# Patient Record
Sex: Male | Born: 1950
Health system: Southern US, Community
[De-identification: ages and names within clinical notes are randomized; demographics above are authoritative.]

## PROBLEM LIST (undated history)

## (undated) DIAGNOSIS — I10 Essential (primary) hypertension: Secondary | ICD-10-CM

## (undated) DIAGNOSIS — T8859XA Other complications of anesthesia, initial encounter: Secondary | ICD-10-CM

## (undated) DIAGNOSIS — M889 Osteitis deformans of unspecified bone: Secondary | ICD-10-CM

## (undated) DIAGNOSIS — R972 Elevated prostate specific antigen [PSA]: Secondary | ICD-10-CM

## (undated) DIAGNOSIS — K648 Other hemorrhoids: Secondary | ICD-10-CM

## (undated) DIAGNOSIS — T4145XA Adverse effect of unspecified anesthetic, initial encounter: Secondary | ICD-10-CM

## (undated) DIAGNOSIS — D472 Monoclonal gammopathy: Secondary | ICD-10-CM

## (undated) DIAGNOSIS — D126 Benign neoplasm of colon, unspecified: Secondary | ICD-10-CM

## (undated) DIAGNOSIS — E559 Vitamin D deficiency, unspecified: Secondary | ICD-10-CM

## (undated) DIAGNOSIS — J189 Pneumonia, unspecified organism: Secondary | ICD-10-CM

## (undated) HISTORY — DX: Other hemorrhoids: K64.8

## (undated) HISTORY — PX: KNEE ARTHROSCOPY: SUR90

## (undated) HISTORY — DX: Vitamin D deficiency, unspecified: E55.9

## (undated) HISTORY — PX: BACK SURGERY: SHX140

## (undated) HISTORY — DX: Elevated prostate specific antigen (PSA): R97.20

## (undated) HISTORY — DX: Benign neoplasm of colon, unspecified: D12.6

## (undated) HISTORY — DX: Monoclonal gammopathy: D47.2

## (undated) HISTORY — DX: Osteitis deformans of unspecified bone: M88.9

---

## 2011-02-17 ENCOUNTER — Other Ambulatory Visit: Payer: Self-pay | Admitting: Otolaryngology

## 2011-02-21 ENCOUNTER — Encounter (HOSPITAL_COMMUNITY): Payer: Self-pay

## 2011-03-03 ENCOUNTER — Encounter (HOSPITAL_COMMUNITY): Payer: Self-pay

## 2011-03-03 ENCOUNTER — Encounter (HOSPITAL_COMMUNITY)
Admission: RE | Admit: 2011-03-03 | Discharge: 2011-03-03 | Disposition: A | Discharge status: Home / Self Care | Payer: BC Managed Care – PPO | Source: Ambulatory Visit | Attending: Specialist | Admitting: Specialist

## 2011-03-03 ENCOUNTER — Ambulatory Visit (HOSPITAL_COMMUNITY)
Admission: RE | Admit: 2011-03-03 | Discharge: 2011-03-03 | Disposition: A | Discharge status: Home / Self Care | Payer: BC Managed Care – PPO | Source: Ambulatory Visit | Attending: Otolaryngology | Admitting: Otolaryngology

## 2011-03-03 DIAGNOSIS — Z01812 Encounter for preprocedural laboratory examination: Secondary | ICD-10-CM | POA: Insufficient documentation

## 2011-03-03 DIAGNOSIS — M48061 Spinal stenosis, lumbar region without neurogenic claudication: Secondary | ICD-10-CM | POA: Insufficient documentation

## 2011-03-03 DIAGNOSIS — Z01811 Encounter for preprocedural respiratory examination: Secondary | ICD-10-CM | POA: Insufficient documentation

## 2011-03-03 HISTORY — DX: Adverse effect of unspecified anesthetic, initial encounter: T41.45XA

## 2011-03-03 HISTORY — DX: Essential (primary) hypertension: I10

## 2011-03-03 HISTORY — DX: Other complications of anesthesia, initial encounter: T88.59XA

## 2011-03-03 LAB — DIFFERENTIAL
Basophils Absolute: 0 10*3/uL (ref 0.0–0.1)
Lymphocytes Relative: 34 % (ref 12–46)
Neutro Abs: 3.2 10*3/uL (ref 1.7–7.7)
Neutrophils Relative %: 57 % (ref 43–77)

## 2011-03-03 LAB — COMPREHENSIVE METABOLIC PANEL
AST: 23 U/L (ref 0–37)
BUN: 9 mg/dL (ref 6–23)
CO2: 26 mEq/L (ref 19–32)
Calcium: 9.3 mg/dL (ref 8.4–10.5)
Creatinine, Ser: 0.78 mg/dL (ref 0.50–1.35)
GFR calc Af Amer: >90 mL/min (ref >90)
GFR calc non Af Amer: >90 mL/min (ref >90)
Glucose, Bld: 94 mg/dL (ref 70–99)

## 2011-03-03 LAB — CBC
MCH: 27.9 pg (ref 26.0–34.0)
MCV: 81.5 fL (ref 78.0–100.0)
Platelets: 277 10*3/uL (ref 150–400)
RBC: 5.19 MIL/uL (ref 4.22–5.81)

## 2011-03-03 LAB — URINALYSIS, ROUTINE W REFLEX MICROSCOPIC
Ketones, ur: NEGATIVE mg/dL
Leukocytes, UA: NEGATIVE
Protein, ur: NEGATIVE mg/dL
Urobilinogen, UA: 0.2 mg/dL (ref 0.0–1.0)

## 2011-03-03 LAB — PROTIME-INR
INR: 0.91 (ref 0.00–1.49)
Prothrombin Time: 12.5 seconds (ref 11.6–15.2)

## 2011-03-03 LAB — SURGICAL PCR SCREEN: MRSA, PCR: NEGATIVE

## 2011-03-03 MED ORDER — CHLORHEXIDINE GLUCONATE 4 % EX LIQD
60.0000 mL | Freq: Once | CUTANEOUS | Status: DC
  Filled 2011-03-03: qty 60

## 2011-03-03 NOTE — Patient Instructions (Signed)
[address] GOVERNOR MATOS  03/03/2011   Your procedure is scheduled on:  03/05/11  Surgery  1100-1304   Wednesday  Report to [facility] at  0830 AM.  Call this number if you have problems the morning of surgery: [phone]   Remember:   Do not eat food:After Midnight.  Tuesday night  May have clear liquids:until Midnight .  Tuesday night  Clear liquids include soda, tea, black coffee, apple or grape juice, broth.  Take these medicines the morning of surgery with A SIP OF WATER:Norco if needed with sip water   Do not wear jewelry, make-up or nail polish.  Do not wear lotions, powders, or perfumes. You may wear deodorant.  Do not shave 48 hours prior to surgery.  Do not bring valuables to the hospital.  Contacts, dentures or bridgework may not be worn into surgery.  Leave suitcase in the car. After surgery it may be brought to your room.  For patients admitted to the hospital, checkout time is 11:00 AM the day of discharge.   Patients discharged the day of surgery will not be allowed to drive home.  Name and phone number of your driver: wife  Special Instructions: CHG Shower Use Special Wash: 1/2 bottle night before surgery and 1/2 bottle morning of surgery.  Regular soap face and privates   Please read over the following fact sheets that you were given: MRSA Information

## 2011-03-05 ENCOUNTER — Ambulatory Visit (HOSPITAL_COMMUNITY): Payer: BC Managed Care – PPO | Admitting: Anesthesiology

## 2011-03-05 ENCOUNTER — Ambulatory Visit (HOSPITAL_COMMUNITY): Payer: BC Managed Care – PPO

## 2011-03-05 ENCOUNTER — Observation Stay (HOSPITAL_COMMUNITY)
Admission: RE | Admit: 2011-03-05 | Discharge: 2011-03-06 | Disposition: A | Discharge status: Home / Self Care | Payer: BC Managed Care – PPO | Source: Ambulatory Visit | Attending: Specialist | Admitting: Specialist

## 2011-03-05 ENCOUNTER — Encounter (HOSPITAL_COMMUNITY): Payer: Self-pay | Admitting: *Deleted

## 2011-03-05 ENCOUNTER — Encounter (HOSPITAL_COMMUNITY)
Admission: RE | Disposition: A | Discharge status: Home / Self Care | Payer: Self-pay | Source: Ambulatory Visit | Attending: Specialist

## 2011-03-05 ENCOUNTER — Encounter (HOSPITAL_COMMUNITY): Payer: Self-pay | Admitting: Anesthesiology

## 2011-03-05 DIAGNOSIS — I10 Essential (primary) hypertension: Secondary | ICD-10-CM | POA: Insufficient documentation

## 2011-03-05 DIAGNOSIS — Z79899 Other long term (current) drug therapy: Secondary | ICD-10-CM | POA: Insufficient documentation

## 2011-03-05 DIAGNOSIS — M5126 Other intervertebral disc displacement, lumbar region: Principal | ICD-10-CM | POA: Insufficient documentation

## 2011-03-05 DIAGNOSIS — M48061 Spinal stenosis, lumbar region without neurogenic claudication: Secondary | ICD-10-CM

## 2011-03-05 HISTORY — PX: LUMBAR LAMINECTOMY/DECOMPRESSION MICRODISCECTOMY: SHX5026

## 2011-03-05 SURGERY — LUMBAR LAMINECTOMY/DECOMPRESSION MICRODISCECTOMY
Anesthesia: General | Site: Back | Wound class: Clean

## 2011-03-05 MED ORDER — FENTANYL CITRATE 0.05 MG/ML IJ SOLN
INTRAMUSCULAR | Status: DC | PRN
  Administered 2011-03-05: 100 ug via INTRAVENOUS
  Administered 2011-03-05 (×5): 50 ug via INTRAVENOUS

## 2011-03-05 MED ORDER — METHOCARBAMOL 500 MG PO TABS: 500.0000 mg | ORAL_TABLET | Freq: Four times a day (QID) | ORAL | Status: DC | PRN

## 2011-03-05 MED ORDER — METHOCARBAMOL 100 MG/ML IJ SOLN
500.0000 mg | Freq: Four times a day (QID) | INTRAVENOUS | Status: DC | PRN
  Filled 2011-03-05: qty 5

## 2011-03-05 MED ORDER — NEOSTIGMINE METHYLSULFATE 1 MG/ML IJ SOLN
INTRAMUSCULAR | Status: DC | PRN
  Administered 2011-03-05: 4 mg via INTRAVENOUS

## 2011-03-05 MED ORDER — MIDAZOLAM HCL 5 MG/5ML IJ SOLN
INTRAMUSCULAR | Status: DC | PRN
  Administered 2011-03-05: 2 mg via INTRAVENOUS

## 2011-03-05 MED ORDER — PROMETHAZINE HCL 25 MG/ML IJ SOLN: 6.2500 mg | INTRAMUSCULAR | Status: DC | PRN

## 2011-03-05 MED ORDER — HYDROMORPHONE HCL PF 1 MG/ML IJ SOLN: 0.5000 mg | INTRAMUSCULAR | Status: DC | PRN

## 2011-03-05 MED ORDER — PHENOL 1.4 % MT LIQD
1.0000 | OROMUCOSAL | Status: DC | PRN
  Filled 2011-03-05: qty 177

## 2011-03-05 MED ORDER — LACTATED RINGERS IV SOLN: INTRAVENOUS | Status: DC

## 2011-03-05 MED ORDER — ONDANSETRON HCL 4 MG/2ML IJ SOLN: 4.0000 mg | INTRAMUSCULAR | Status: DC | PRN

## 2011-03-05 MED ORDER — SODIUM CHLORIDE 0.9 % IJ SOLN: 3.0000 mL | INTRAMUSCULAR | Status: DC | PRN

## 2011-03-05 MED ORDER — BUPIVACAINE-EPINEPHRINE 0.5% -1:200000 IJ SOLN
INTRAMUSCULAR | Status: DC | PRN
  Administered 2011-03-05: 50 mL

## 2011-03-05 MED ORDER — PROPOFOL 10 MG/ML IV BOLUS
INTRAVENOUS | Status: DC | PRN
  Administered 2011-03-05: 180 mg via INTRAVENOUS

## 2011-03-05 MED ORDER — SODIUM CHLORIDE 0.9 % IV SOLN: INTRAVENOUS | Status: DC

## 2011-03-05 MED ORDER — SODIUM CHLORIDE 0.9 % IJ SOLN
3.0000 mL | Freq: Two times a day (BID) | INTRAMUSCULAR | Status: DC
  Administered 2011-03-05 – 2011-03-06 (×2): 3 mL via INTRAVENOUS

## 2011-03-05 MED ORDER — CEFAZOLIN SODIUM-DEXTROSE 2-3 GM-% IV SOLR
2.0000 g | INTRAVENOUS | Status: AC
  Administered 2011-03-05: 2 g via INTRAVENOUS

## 2011-03-05 MED ORDER — CEFAZOLIN SODIUM-DEXTROSE 2-3 GM-% IV SOLR
2.0000 g | Freq: Three times a day (TID) | INTRAVENOUS | Status: AC
  Administered 2011-03-05 – 2011-03-06 (×2): 2 g via INTRAVENOUS
  Filled 2011-03-05 (×3): qty 50

## 2011-03-05 MED ORDER — HYDROMORPHONE HCL PF 1 MG/ML IJ SOLN: 0.2500 mg | INTRAMUSCULAR | Status: DC | PRN

## 2011-03-05 MED ORDER — SODIUM CHLORIDE 0.9 % IV SOLN: 250.0000 mL | INTRAVENOUS | Status: DC

## 2011-03-05 MED ORDER — THROMBIN 5000 UNITS EX KIT
PACK | CUTANEOUS | Status: DC | PRN
  Administered 2011-03-05: 10000 [IU] via TOPICAL

## 2011-03-05 MED ORDER — ROCURONIUM BROMIDE 100 MG/10ML IV SOLN
INTRAVENOUS | Status: DC | PRN
  Administered 2011-03-05 (×2): 10 mg via INTRAVENOUS
  Administered 2011-03-05: 50 mg via INTRAVENOUS

## 2011-03-05 MED ORDER — LIDOCAINE HCL (CARDIAC) 20 MG/ML IV SOLN
INTRAVENOUS | Status: DC | PRN
  Administered 2011-03-05: 80 mg via INTRAVENOUS

## 2011-03-05 MED ORDER — HYDROCODONE-ACETAMINOPHEN 7.5-325 MG PO TABS
1.0000 | ORAL_TABLET | ORAL | Status: DC | PRN
  Administered 2011-03-05 – 2011-03-06 (×2): 2 via ORAL
  Filled 2011-03-05 (×2): qty 2

## 2011-03-05 MED ORDER — ACETAMINOPHEN 10 MG/ML IV SOLN
INTRAVENOUS | Status: DC | PRN
  Administered 2011-03-05: 1000 mg via INTRAVENOUS

## 2011-03-05 MED ORDER — ZOLPIDEM TARTRATE 10 MG PO TABS: 10.0000 mg | ORAL_TABLET | Freq: Every evening | ORAL | Status: DC | PRN

## 2011-03-05 MED ORDER — ACETAMINOPHEN 650 MG RE SUPP: 650.0000 mg | RECTAL | Status: DC | PRN

## 2011-03-05 MED ORDER — DOCUSATE SODIUM 100 MG PO CAPS
100.0000 mg | ORAL_CAPSULE | Freq: Two times a day (BID) | ORAL | Status: DC
  Administered 2011-03-05 – 2011-03-06 (×2): 100 mg via ORAL
  Filled 2011-03-05 (×3): qty 1

## 2011-03-05 MED ORDER — AMLODIPINE BESYLATE 5 MG PO TABS
5.0000 mg | ORAL_TABLET | Freq: Every day | ORAL | Status: DC
  Administered 2011-03-06: 5 mg via ORAL
  Filled 2011-03-05: qty 1

## 2011-03-05 MED ORDER — LACTATED RINGERS IV SOLN
INTRAVENOUS | Status: DC
  Administered 2011-03-05: 12:00:00 via INTRAVENOUS
  Administered 2011-03-05: 1000 mL via INTRAVENOUS

## 2011-03-05 MED ORDER — AMLODIPINE BESYLATE 5 MG PO TABS
5.0000 mg | ORAL_TABLET | Freq: Every day | ORAL | Status: DC
  Administered 2011-03-05: 5 mg via ORAL
  Filled 2011-03-05: qty 1

## 2011-03-05 MED ORDER — OLMESARTAN MEDOXOMIL 40 MG PO TABS
40.0000 mg | ORAL_TABLET | Freq: Every day | ORAL | Status: DC
  Administered 2011-03-06: 40 mg via ORAL
  Filled 2011-03-05: qty 1

## 2011-03-05 MED ORDER — MENTHOL 3 MG MT LOZG
1.0000 | LOZENGE | OROMUCOSAL | Status: DC | PRN
  Filled 2011-03-05: qty 9

## 2011-03-05 MED ORDER — AMLODIPINE-OLMESARTAN 5-40 MG PO TABS: 1.0000 | ORAL_TABLET | ORAL | Status: DC

## 2011-03-05 MED ORDER — SODIUM CHLORIDE 0.9 % IR SOLN
Status: DC | PRN
  Administered 2011-03-05: 11:00:00

## 2011-03-05 MED ORDER — GLYCOPYRROLATE 0.2 MG/ML IJ SOLN
INTRAMUSCULAR | Status: DC | PRN
  Administered 2011-03-05: .6 mg via INTRAVENOUS

## 2011-03-05 MED ORDER — ONDANSETRON HCL 4 MG/2ML IJ SOLN
INTRAMUSCULAR | Status: DC | PRN
  Administered 2011-03-05: 4 mg via INTRAVENOUS

## 2011-03-05 SURGICAL SUPPLY — 43 items
BAG ZIPLOCK 12X15 (MISCELLANEOUS) ×2 IMPLANT
BENZOIN TINCTURE PRP APPL 2/3 (GAUZE/BANDAGES/DRESSINGS) ×2 IMPLANT
CHLORAPREP W/TINT 26ML (MISCELLANEOUS) IMPLANT
CLEANER TIP ELECTROSURG 2X2 (MISCELLANEOUS) ×2 IMPLANT
CLOTH BEACON ORANGE TIMEOUT ST (SAFETY) ×2 IMPLANT
DECANTER SPIKE VIAL GLASS SM (MISCELLANEOUS) ×2 IMPLANT
DRAPE MICROSCOPE LEICA (MISCELLANEOUS) ×2 IMPLANT
DRAPE POUCH INSTRU U-SHP 10X18 (DRAPES) ×2 IMPLANT
DRAPE SURG 17X11 SM STRL (DRAPES) ×2 IMPLANT
DRSG EMULSION OIL 3X3 NADH (GAUZE/BANDAGES/DRESSINGS) IMPLANT
DRSG PAD ABDOMINAL 8X10 ST (GAUZE/BANDAGES/DRESSINGS) IMPLANT
DRSG TELFA 4X5 ISLAND ADH (GAUZE/BANDAGES/DRESSINGS) IMPLANT
DURAPREP 26ML APPLICATOR (WOUND CARE) ×2 IMPLANT
ELECT REM PT RETURN 9FT ADLT (ELECTROSURGICAL) ×2
ELECTRODE REM PT RTRN 9FT ADLT (ELECTROSURGICAL) ×1 IMPLANT
GLOVE BIOGEL PI IND STRL 6.5 (GLOVE) ×1 IMPLANT
GLOVE BIOGEL PI IND STRL 8 (GLOVE) ×1 IMPLANT
GLOVE BIOGEL PI INDICATOR 6.5 (GLOVE) ×1
GLOVE BIOGEL PI INDICATOR 8 (GLOVE) ×1
GLOVE ECLIPSE 6.5 STRL STRAW (GLOVE) ×2 IMPLANT
GLOVE SURG SS PI 8.0 STRL IVOR (GLOVE) ×4 IMPLANT
KIT BASIN OR (CUSTOM PROCEDURE TRAY) ×2 IMPLANT
KIT POSITIONING SURG ANDREWS (MISCELLANEOUS) ×2 IMPLANT
MANIFOLD NEPTUNE II (INSTRUMENTS) ×2 IMPLANT
NEEDLE SPNL 18GX3.5 QUINCKE PK (NEEDLE) ×6 IMPLANT
PATTIES SURGICAL .5 X.5 (GAUZE/BANDAGES/DRESSINGS) IMPLANT
PATTIES SURGICAL .75X.75 (GAUZE/BANDAGES/DRESSINGS) IMPLANT
PATTIES SURGICAL 1X1 (DISPOSABLE) IMPLANT
SPONGE SURGIFOAM ABS GEL 100 (HEMOSTASIS) ×2 IMPLANT
STAPLER VISISTAT (STAPLE) IMPLANT
STRIP CLOSURE SKIN 1/2X4 (GAUZE/BANDAGES/DRESSINGS) IMPLANT
SUT PROLENE 3 0 PS 2 (SUTURE) IMPLANT
SUT VIC AB 0 CT1 27 (SUTURE)
SUT VIC AB 0 CT1 27XBRD ANTBC (SUTURE) IMPLANT
SUT VIC AB 1 CT1 27 (SUTURE) ×1
SUT VIC AB 1 CT1 27XBRD ANTBC (SUTURE) ×1 IMPLANT
SUT VIC AB 1-0 CT2 27 (SUTURE) IMPLANT
SUT VIC AB 2-0 CT1 27 (SUTURE) ×1
SUT VIC AB 2-0 CT1 TAPERPNT 27 (SUTURE) ×1 IMPLANT
SUT VICRYL 0 UR6 27IN ABS (SUTURE) IMPLANT
SYRINGE 10CC LL (SYRINGE) ×4 IMPLANT
TRAY LAMINECTOMY (CUSTOM PROCEDURE TRAY) ×2 IMPLANT
YANKAUER SUCT BULB TIP NO VENT (SUCTIONS) ×2 IMPLANT

## 2011-03-05 NOTE — Transfer of Care (Signed)
Immediate Anesthesia Transfer of Care Note  Patient: Lance King  Procedure(s) Performed:  LUMBAR LAMINECTOMY/DECOMPRESSION MICRODISCECTOMY - Re do Decompression L3-L5  (X-Ray)    Central  Patient Location: PACU  Anesthesia Type: General  Level of Consciousness: awake and sedated  Airway & Oxygen Therapy: Patient Spontanous Breathing and Patient connected to face mask oxygen  Post-op Assessment: Report given to PACU RN, Post -op Vital signs reviewed and stable and Patient moving all extremities  Post vital signs: Reviewed and stable  Complications: No apparent anesthesia complications

## 2011-03-05 NOTE — Anesthesia Postprocedure Evaluation (Signed)
  Anesthesia Post-op Note  Patient: Lance King  Procedure(s) Performed:  LUMBAR LAMINECTOMY/DECOMPRESSION MICRODISCECTOMY - Re do Decompression L3-L5  (X-Ray)    Central  Patient Location: PACU  Anesthesia Type: General  Level of Consciousness: awake and alert   Airway and Oxygen Therapy: Patient Spontanous Breathing  Post-op Pain: mild  Post-op Assessment: Post-op Vital signs reviewed, Patient's Cardiovascular Status Stable, Respiratory Function Stable, Patent Airway and No signs of Nausea or vomiting  Post-op Vital Signs: stable  Complications: No apparent anesthesia complications

## 2011-03-05 NOTE — Anesthesia Preprocedure Evaluation (Addendum)
Anesthesia Evaluation  Patient identified by MRN, date of birth, ID band Patient awake    Reviewed: Allergy & Precautions, H&P , NPO status , Patient's Chart, lab work & pertinent test results  History of Anesthesia Complications Negative for: history of anesthetic complications  Airway Mallampati: II TM Distance: >3 FB Neck ROM: full    Dental  (+) Caps, Teeth Intact and Dental Advisory Given,    Pulmonary neg pulmonary ROS,  clear to auscultation  Pulmonary exam normal       Cardiovascular Exercise Tolerance: Good hypertension, On Medications neg cardio ROS regular Normal    Neuro/Psych Negative Neurological ROS  Negative Psych ROS   GI/Hepatic negative GI ROS, Neg liver ROS,   Endo/Other  Negative Endocrine ROS  Renal/GU negative Renal ROS  Genitourinary negative   Musculoskeletal   Abdominal   Peds  Hematology negative hematology ROS (+)   Anesthesia Other Findings   Reproductive/Obstetrics negative OB ROS                          Anesthesia Physical Anesthesia Plan  ASA: II  Anesthesia Plan: General   Post-op Pain Management:    Induction: Intravenous  Airway Management Planned: Oral ETT  Additional Equipment:   Intra-op Plan:   Post-operative Plan: Extubation in OR  Informed Consent: I have reviewed the patients History and Physical, chart, labs and discussed the procedure including the risks, benefits and alternatives for the proposed anesthesia with the patient or authorized representative who has indicated his/her understanding and acceptance.   Dental Advisory Given  Plan Discussed with: CRNA and Surgeon  Anesthesia Plan Comments:        Anesthesia Quick Evaluation

## 2011-03-05 NOTE — Op Note (Signed)
NAME:  WILLOW, RECZEK NO.:  [id number]  MEDICAL RECORD NO.:  [id number]  LOCATION:  [facility]                         FACILITY:  [facility]  PHYSICIAN:  Jene Every, M.D.    DATE OF BIRTH:  17-May-1950  DATE OF PROCEDURE:  03/05/2011 DATE OF DISCHARGE:                              OPERATIVE REPORT   PREOPERATIVE DIAGNOSES:  Spinal stenosis L3-L4, L4-L5, recurrent and herniated nucleus pulposus at L3-L4.  POSTOPERATIVE DIAGNOSES:  Spinal stenosis L3-L4, L4-L5, recurrent and herniated nucleus pulposus at L3-L4.  PROCEDURE PERFORMED:  Lumbar decompression at L3-L4,  L4-L5 with bilateral hemilaminotomies, lateral recessed decompression with foraminotomies at L4 and L5.  Microdiskectomy at L3-L4, left.  ANESTHESIA:  General.  ASSISTANT:  Roma Schanz, P.A.  BRIEF HISTORY AND INDICATION:  This is a 60 year old with predominant lower extremity radicular pain secondary to severe lateral recess stenosis, L4-L5, disk herniation L3-L4, and stenosis at L3-L4 as well, and stenosis at L4-L5 with history of decompression of L5-S1.  There is some minor lateral recess at L5-S1, but did not have L5-S1 symptomatology.  It was indicated for decompression at L3-L4 and L4-L5, essentially into the left due to the severe stenosis.  Risks and benefits were discussed including bleeding, infection, damage to other structures.  No change in symptoms, worsening symptoms, repeat debridement, DVT, PE, anesthetic complications etc.  TECHNIQUE:  With the patient in supine position, after induction of adequate general anesthesia, 2 g Kefzol,, was placed prone on the [place] frame.  All bony prominences were well padded.  Foley to gravity.  Abdomen free.  Arms in the well-padded position less, than 90 degrees at the shoulders.  The lumbar region was prepped and draped in the usual sterile fashion.  Two 18-gauge spinal needles were utilized to localize the spinous process of L3-L5.  We made an  incision from the spinous process of L3-L5.  Subcutaneous tissue was dissected. Electrocautery utilized to achieve hemostasis.  Dorsolumbar fascia identified by line of skin incision.  0.25% Marcaine with epinephrine was infiltrated in the subcutaneous tissue, into the paraspinous muscular.  McCullough retractor was placed.  We elevated the paraspinous musculature from lamina of 3, 4, and 5.  Confirmatory radiograph was obtained at L3 and at L4-L5. We removed the spinous processes through small interlaminar windows at L3-L4 and L4-5.  To preserve the facet, we decided to perform a central decompression and remove the spinous processes of L4 and L3.  Operating microscope was draped, brought on the surgical field.  Hemilaminotomy of the caudad edge of L4 was performed with a 2 mm Kerrison.  Severe ligamentum flavum hypertrophy was noted bilaterally, extending to near the midline.  With the neural elements well protected with neural patties, we decompressed both lateral recesses to the medial border of the pedicle.  It was more severe on the left than on the right with compression of the 5 root and the 4 root in the lateral recess, into the foramen.  With the neural elements well protected, we performed foraminotomies of L4 and L5 bilaterally.  Hockey- stick probe was passed freely at both those foramens following the decompression, especially down at the foramen of L5, beneath the pedicle of  L5, distal to L5-S1 without evidence of stenosis.  In a similar fashion, we performed hemilaminotomies of the cephalad edge of L4 bilaterally and the caudad edge of L3.  Hypertrophic ligamentum flavum was noted centrally and laterally as well as at L4-L5.  Again this severe lateral recess stenosis was noted bilaterally with decompressed lateral recess to the medial border of the pedicle.  Epidural venous plexus was noted on the left as well as disk herniation at L3-L4, which was identified with an  18-gauge needle.  An x-ray demonstrated L3-L4 and decompression at 5.  After the foraminotomies of L4, we gently mobilized the thecal sac immediately as well as the root of L4 and performed an annulotomy.  Disk material was removed from the disk space with micropituitary, degenerated disks, and had multiple passes to remove the gelatinous degenerated herniated disk.  Further compressed disk with a hockey stick, herniated material was then removed.  We performed foraminotomy at L3 and hockey-stick probe passed freely up the foramen of L3 and L4 with bipolar cautery to achieve hemostasis.  We irrigated the disk space with antibiotic irrigation through Angiocath.  Copiously irrigated the laminotomies and placed bone wax on the cancellous surfaces.  Good restoration of the thecal sac without CSF leakage or active bleeding.  Good decompression on the right as well.  Thrombin- soaked Gelfoam was placed in the laminotomy defect at L3-L4 and L4-L5 after copious irrigation.  Again, good restoration of the thecal sac without evidence of bleeding or CSF leakage.  I then removed the [facility] retractor.  Paraspinous muscle was inspected and electrocautery utilized to achieve hemostasis.  Copious irrigation was used once again.  The dorsolumbar fascia was reapproximated with #1 Vicryl interrupted figure-of-8 sutures, along with the paraspinous musculature, subcu with 0 and 2-0 Vicryl simple sutures.  Skin was reapproximated with staples.  The wound was dressed sterilely.  He was placed supine on the hospital bed, extubated without difficulty, transported to recovery room in satisfactory condition.  The patient tolerated the procedure well.  No complications.  Assistant Roma Schanz was utilized for assistance with gentle nerve retraction, and suction to provide visualization.     Jene Every, M.D.     Cordelia Pen  D:  03/05/2011  T:  03/05/2011  Job:  [id number]

## 2011-03-05 NOTE — Brief Op Note (Signed)
03/05/2011  12:55 PM  PATIENT:  Lance King  60 y.o. male  PRE-OPERATIVE DIAGNOSIS:  Spinal stenosis, lumbar region, without neurogenic claudication [724.02]  POST-OPERATIVE DIAGNOSIS:  spinal stenosis L3-4 L4-5  PROCEDURE:  Procedure(s): LUMBAR LAMINECTOMY/DECOMPRESSION MICRODISCECTOMY L34, L45  SURGEON:  Surgeon(s): Lacretia Leigh Amaziah Ghosh  PHYSICIAN ASSISTANT:   ASSISTANTS: Strader   ANESTHESIA:   general  EBL:  Total I/O In: 1150 [I.V.:1000; IV Piggyback:150] Out: 425 [Urine:375; Blood:50]  BLOOD ADMINISTERED:none  DRAINS: none   LOCAL MEDICATIONS USED:  MARCAINE 20CC  SPECIMEN:  No Specimen  DISPOSITION OF SPECIMEN:  N/A  COUNTS:  YES  TOURNIQUET:  * No tourniquets in log *  DICTATION: .Other Dictation: Dictation Number [id number]  PLAN OF CARE: Admit for overnight observation  PATIENT DISPOSITION:  PACU - hemodynamically stable.   Delay start of Pharmacological VTE agent (>24hrs) due to surgical blood loss or risk of bleeding:  {YES/NO/NOT APPLICABLE:20182

## 2011-03-05 NOTE — H&P (Signed)
Lance King is an 60 y.o. male.   Chief Complaint:Bilateral claudication HPI: 60 yo with neurogenic claudication due to spinal stenosis L34, L45  Past Medical History  Diagnosis Date  . Complication of anesthesia     states a smaller tube has been used in the past due to sore throat  . Hypertension     EKG PCP 02/05/11 on chart    Past Surgical History  Procedure Date  . Back surgery     Lumbar decompression 1994  . Knee arthroscopy     1987    History reviewed. No pertinent family history. Social History:  reports that he has never smoked. He has never used smokeless tobacco. He reports that he drinks about .6 ounces of alcohol per week. He reports that he does not use illicit drugs.  Allergies: No Known Allergies  Medications Prior to Admission  Medication Dose Route Frequency Provider Last Rate Last Dose  . amLODipine (NORVASC) tablet 5 mg  5 mg Oral Daily Keionte Swicegood C Keshanna Riso   5 mg at 03/05/11 0851  . ceFAZolin (ANCEF) IVPB 2 g/50 mL premix  2 g Intravenous 60 min Pre-Op [name], PA      . lactated ringers infusion   Intravenous Continuous [name], PA 50 mL/hr at 03/05/11 1004 1,000 mL at 03/05/11 1004   Medications Prior to Admission  Medication Sig Dispense Refill  . amLODipine-olmesartan (AZOR) 5-40 MG per tablet Take 1 tablet by mouth every morning.       Marland Kitchen HYDROcodone-acetaminophen (NORCO) 7.5-325 MG per tablet Take 1 tablet by mouth every 6 (six) hours as needed. For pain        Results for orders placed during the hospital encounter of 03/03/11 (from the past 48 hour(s))  CBC     Status: Normal   Collection Time   03/03/11 10:35 AM      Component Value Range Comment   WBC 5.6  4.0 - 10.5 (K/uL)    RBC 5.19  4.22 - 5.81 (MIL/uL)    Hemoglobin 14.5  13.0 - 17.0 (g/dL)    HCT 62.1  30.8 - 65.7 (%)    MCV 81.5  78.0 - 100.0 (fL)    MCH 27.9  26.0 - 34.0 (pg)    MCHC 34.3  30.0 - 36.0 (g/dL)    RDW 84.6  96.2 - 95.2 (%)    Platelets 277   150 - 400 (K/uL)   COMPREHENSIVE METABOLIC PANEL     Status: Abnormal   Collection Time   03/03/11 10:35 AM      Component Value Range Comment   Sodium 140  135 - 145 (mEq/L)    Potassium 3.8  3.5 - 5.1 (mEq/L)    Chloride 104  96 - 112 (mEq/L)    CO2 26  19 - 32 (mEq/L)    Glucose, Bld 94  70 - 99 (mg/dL)    BUN 9  6 - 23 (mg/dL)    Creatinine, Ser 8.41  0.50 - 1.35 (mg/dL)    Calcium 9.3  8.4 - 10.5 (mg/dL)    Total Protein 6.9  6.0 - 8.3 (g/dL)    Albumin 3.9  3.5 - 5.2 (g/dL)    AST 23  0 - 37 (U/L) NO VISIBLE HEMOLYSIS   ALT 34  0 - 53 (U/L)    Alkaline Phosphatase 167 (*) 39 - 117 (U/L)    Total Bilirubin 0.8  0.3 - 1.2 (mg/dL)    GFR calc  non Af Amer >90  >90 (mL/min)    GFR calc Af Amer >90  >90 (mL/min)   DIFFERENTIAL     Status: Normal   Collection Time   03/03/11 10:35 AM      Component Value Range Comment   Neutrophils Relative 57  43 - 77 (%)    Neutro Abs 3.2  1.7 - 7.7 (K/uL)    Lymphocytes Relative 34  12 - 46 (%)    Lymphs Abs 1.9  0.7 - 4.0 (K/uL)    Monocytes Relative 8  3 - 12 (%)    Monocytes Absolute 0.5  0.1 - 1.0 (K/uL)    Eosinophils Relative 0  0 - 5 (%)    Eosinophils Absolute 0.0  0.0 - 0.7 (K/uL)    Basophils Relative 0  0 - 1 (%)    Basophils Absolute 0.0  0.0 - 0.1 (K/uL)   PROTIME-INR     Status: Normal   Collection Time   03/03/11 10:35 AM      Component Value Range Comment   Prothrombin Time 12.5  11.6 - 15.2 (seconds)    INR 0.91  0.00 - 1.49    SURGICAL PCR SCREEN     Status: Normal   Collection Time   03/03/11 10:39 AM      Component Value Range Comment   MRSA, PCR NEGATIVE  NEGATIVE     Staphylococcus aureus NEGATIVE  NEGATIVE    ABO/RH     Status: Normal   Collection Time   03/03/11 11:00 AM      Component Value Range Comment   ABO/RH(D) O NEG       Chest 2 View  03/03/2011  *RADIOLOGY REPORT*  Clinical Data: Preoperative respiratory films.  CHEST - 2 VIEW  Comparison: None.  Findings: The lungs are clear.  Heart size is normal.   No pneumothorax or pleural effusion.  IMPRESSION: Negative chest.  Original Report Authenticated By: Bernadene Bell. Maricela Curet, M.D.    Review of Systems  Constitutional: Negative.   HENT: Negative.   Eyes: Negative.   Respiratory: Negative.   Cardiovascular: Negative.   Gastrointestinal: Negative.   Genitourinary: Negative.   Musculoskeletal: Positive for back pain.  Skin: Negative.   Neurological: Positive for tingling.  Endo/Heme/Allergies: Negative.   Psychiatric/Behavioral: Negative.     Blood pressure 143/80, pulse 80, temperature 98.2 F (36.8 C), temperature source Oral, resp. rate 18, SpO2 100.00%. Physical Exam  Constitutional: He appears well-developed.  HENT:  Head: Normocephalic and atraumatic.  Eyes: Pupils are equal, round, and reactive to light.  Neck: Normal range of motion.  Cardiovascular: Normal rate.   Respiratory: Effort normal.  GI: Soft.  Musculoskeletal: Normal range of motion.  Neurological:       Positive straight leg raise bilat. bilat quad weakness. Pulses intact. No DVT     Assessment/Plan Neurogenic claudication due to spinal stenosis L34, L45. Plan decompression L34, L45. Risks and benefits discussed.  [name] C 03/05/2011, 10:32 AM

## 2011-03-06 LAB — CBC
Hemoglobin: 12.6 g/dL — ABNORMAL LOW (ref 13.0–17.0)
RBC: 4.6 MIL/uL (ref 4.22–5.81)

## 2011-03-06 LAB — BASIC METABOLIC PANEL
CO2: 29 mEq/L (ref 19–32)
Glucose, Bld: 104 mg/dL — ABNORMAL HIGH (ref 70–99)
Potassium: 4 mEq/L (ref 3.5–5.1)
Sodium: 138 mEq/L (ref 135–145)

## 2011-03-06 MED ORDER — INFLUENZA VIRUS VACC SPLIT PF IM SUSP
0.5000 mL | INTRAMUSCULAR | Status: DC | PRN
  Filled 2011-03-06: qty 0.5

## 2011-03-06 MED ORDER — INFLUENZA VIRUS VACC SPLIT PF IM SUSP: 0.5000 mL | INTRAMUSCULAR | Status: DC

## 2011-03-06 MED ORDER — HYDROCODONE-ACETAMINOPHEN 7.5-325 MG PO TABS: 1.0000 | ORAL_TABLET | ORAL | Status: AC | PRN

## 2011-03-06 NOTE — Progress Notes (Signed)
Pt discharged home with wife in stable condition. Discharge instructions and scripts given. Pt verbalized understanding.  

## 2011-03-06 NOTE — Discharge Summary (Signed)
Physician Discharge Summary  Patient ID: Lance King MRN: [id number] DOB/AGE: September 03, 1950 60 y.o.  Admit date: 03/05/2011 Discharge date: 03/06/2011  Admission Diagnoses: Spinal stenosis L3-4, L4-5 Discharge Diagnoses:  S/p lumbar decompression L3-4, L4-5   Discharged Condition: good  Hospital Course: Uneventful.  Patient did well post operatively.  He noted minimal low back pain and improvement in LE symptoms.  He was voiding and walking without difficulty.  Basename 03/06/11 0330  WBC 8.6  HCT 37.6*  PLT 247    Basename 03/06/11 0330  NA 138  K 4.0  CL 105  CO2 29  GLUCOSE 104*  BUN 7  CREATININE 0.90  CALCIUM 8.9    Procedure Note: PRE-OPERATIVE DIAGNOSIS: Spinal stenosis, lumbar region, without neurogenic claudication [724.02]  POST-OPERATIVE DIAGNOSIS: spinal stenosis L3-4 L4-5  PROCEDURE: Procedure(s):  LUMBAR LAMINECTOMY/DECOMPRESSION MICRODISCECTOMY L34, L45  SURGEON: Surgeon(s):  [employer]  PHYSICIAN ASSISTANT:  ASSISTANTS: Levelle Edelen  ANESTHESIA: general  EBL: Total I/O  In: 1150 [I.V.:1000; IV Piggyback:150]  Out: 425 [Urine:375; Blood:50]  BLOOD ADMINISTERED:none  DRAINS: none  LOCAL MEDICATIONS USED: MARCAINE 20CC  SPECIMEN: No Specimen  DISPOSITION OF SPECIMEN: N/A  COUNTS: YES  TOURNIQUET: * No tourniquets in log *   Consults: none  Disposition: Home or Self Care  Discharge Orders    Future Orders Please Complete By Expires   Diet - low sodium heart healthy      Call MD / Call 911      Comments:   If you experience chest pain or shortness of breath, CALL 911 and be transported to the hospital emergency room.  If you develope a fever above 101 F, pus (white drainage) or increased drainage or redness at the wound, or calf pain, call your surgeon's office.   Constipation Prevention      Comments:   Drink plenty of fluids.  Prune juice may be helpful.  You may use a stool softener, such as Colace (over the counter) 100 mg twice a day.   Use MiraLax (over the counter) for constipation as needed.   Increase activity slowly as tolerated      Weight Bearing as taught in Physical Therapy      Comments:   Use a walker or crutches as instructed.   Discharge instructions      Comments:   Walk As Tolerated utilizing back precautions.  No bending, twisting, or lifting.  No driving for 2 weeks.  Ok to shower in 72 hours. Start Aspirin 81mg  tomorrow daily for 3 weeks   Care order/instruction      Comments:   Dressing supplies for home     Discharge Medication List as of 03/06/2011 11:14 AM    CONTINUE these medications which have CHANGED   Details  HYDROcodone-acetaminophen (NORCO) 7.5-325 MG per tablet Take 1-2 tablets by mouth every 4 (four) hours as needed., Starting 03/06/2011, Until Sun 03/16/11, Print      CONTINUE these medications which have NOT CHANGED   Details  amLODipine-olmesartan (AZOR) 5-40 MG per tablet Take 1 tablet by mouth every morning. , Until Discontinued, Historical Med       Follow-up Information    Follow up with BEANE,JEFFREY C. Make an appointment in 14 days.   Contact information:   [facility] [address] [place] [address] [phone]          Signed: [name]. 03/06/2011, 1:17 PM

## 2011-03-06 NOTE — Progress Notes (Signed)
Occupational Therapy Evaluation Patient Details Name: Lance King MRN: [id number] DOB: [birth year] Today's Date: 03/06/2011 8:42-9:07 e1 Problem List: There is no problem list on file for this patient.   Past Medical History:  Past Medical History  Diagnosis Date  . Complication of anesthesia     states a smaller tube has been used in the past due to sore throat  . Hypertension     EKG PCP 02/05/11 on chart   Past Surgical History:  Past Surgical History  Procedure Date  . Back surgery     Lumbar decompression 1994  . Knee arthroscopy     1987    OT Assessment/Plan/Recommendation OT Assessment Clinical Impression Statement: Pleasant 60 year old male admitted for elective lumbar discectomy and laminectomy.  Currently able to perform all simulated ADLs and toileting at a supervision level.  He reports that his wife  will be able to provide 24 hour supervsion at d/c.  Does not require any OT related DME.  Did educate patient on  back precautions as well  as available AE for LB selfcare.  No further OT needs. OT Recommendation/Assessment: Patient does not need any further OT services OT Recommendation Follow Up Recommendations: None Equipment Recommended: None recommended by OT Individuals Consulted Consulted and Agree with Results and Recommendations: Patient OT Goals    OT Evaluation Precautions/Restrictions  Precautions Precautions: Back Required Braces or Orthoses: No Restrictions Weight Bearing Restrictions: No Prior Functioning Home Living Lives With: Spouse Receives Help From: Family Type of Home: House Home Layout: One level Home Access: Stairs to enter Entrance Stairs-Rails: Right Entrance Stairs-Number of Steps: 3 Bathroom Shower/Tub: Engineer, manufacturing systems: Handicapped height Bathroom Accessibility: Yes Home Adaptive Equipment: None Prior Function Level of Independence: Independent with basic ADLs;Independent with transfers;Independent with  homemaking with wheelchair Vocation: Retired ADL ADL Eating/Feeding: Simulated;Independent Where Assessed - Eating/Feeding: Chair Grooming: Simulated;Supervision/safety Where Assessed - Grooming: Sitting, chair Upper Body Bathing: Simulated;Set up Where Assessed - Upper Body Bathing: Sit to stand from bed Lower Body Bathing: Performed;Supervision/safety Where Assessed - Lower Body Bathing: Sit to stand from bed Upper Body Dressing: Performed;Set up Where Assessed - Upper Body Dressing: Sit to stand from bed Lower Body Dressing: Performed;Set up Where Assessed - Lower Body Dressing: Sit to stand from bed Toilet Transfer: Simulated;Supervision/safety Toilet Transfer Method: Proofreader: Comfort height toilet Toileting - Clothing Manipulation: Simulated;Supervision/safety Where Assessed - Toileting Clothing Manipulation:  (Sit to stand from EOB) Toileting - Hygiene: Simulated;Supervision/safety Where Assessed - Toileting Hygiene:  (Sit to stand from EOB.) Tub/Shower Transfer: Not assessed Tub/Shower Transfer Method: Not assessed ADL Comments: Pt overall supervision for most selfcare tasks adhering to back precautions.  Educated on back precautions as well as possible need for reacher.  Pt reports that his wife will assist him PRN.  No further OT needs. Vision/Perception  Vision - History Baseline Vision: Wears glasses all the time Perception Perception: Within Functional Limits Praxis Praxis: Intact Cognition Cognition Arousal/Alertness: Awake/alert Overall Cognitive Status: Appears within functional limits for tasks assessed Orientation Level: Oriented X4 Sensation/Coordination Sensation Light Touch: Appears Intact Coordination Gross Motor Movements are Fluid and Coordinated: Yes Fine Motor Movements are Fluid and Coordinated: Yes Extremity Assessment RUE Assessment RUE Assessment: Within Functional Limits LUE Assessment LUE Assessment: Within  Functional Limits Mobility  Bed Mobility Bed Mobility: Yes Rolling Right: 5: Supervision Supine to Sit: 5: Supervision Transfers Transfers: Yes Sit to Stand: 5: Supervision Exercises   End of Session OT - End of Session Activity Tolerance:  Patient tolerated treatment well Patient left: in chair;with call bell in reach General Behavior During Session: [facility] for tasks performed Cognition: [facility] for tasks performed   Halbert Jesson,Akiva OTR/L 03/06/2011, 9:30 AM  Pager number [phone]

## 2011-03-06 NOTE — Progress Notes (Signed)
Subjective: 1 Day Post-Op Procedure(s) (LRB): LUMBAR LAMINECTOMY/DECOMPRESSION MICRODISCECTOMY (N/A) Patient reports pain as 2 on 0-10 scale.    Patient has complaints of mild low back pain.  Denies any LE pain, much improved  Objective: Vital signs in last 24 hours: Temp:  [98.1 F (36.7 C)-99.8 F (37.7 C)] 99 F (37.2 C) (12/06 0614) Pulse Rate:  [72-89] 81  (12/06 0614) Resp:  [15-21] 16  (12/06 0614) BP: (104-152)/(54-84) 104/54 mmHg (12/06 0614) SpO2:  [96 %-100 %] 96 % (12/06 0845) Weight:  [84.823 kg (187 lb)] 187 lb (84.823 kg) (12/05 1645)  Intake/Output from previous day:  Intake/Output Summary (Last 24 hours) at 03/06/11 1013 Last data filed at 03/06/11 0500  Gross per 24 hour  Intake   1750 ml  Output   2575 ml  Net   -825 ml    Intake/Output this shift:    Labs: Results for orders placed during the hospital encounter of 03/05/11  CBC      Component Value Range   WBC 8.6  4.0 - 10.5 (K/uL)   RBC 4.60  4.22 - 5.81 (MIL/uL)   Hemoglobin 12.6 (*) 13.0 - 17.0 (g/dL)   HCT 91.4 (*) 78.2 - 52.0 (%)   MCV 81.7  78.0 - 100.0 (fL)   MCH 27.4  26.0 - 34.0 (pg)   MCHC 33.5  30.0 - 36.0 (g/dL)   RDW 95.6  21.3 - 08.6 (%)   Platelets 247  150 - 400 (K/uL)  BASIC METABOLIC PANEL      Component Value Range   Sodium 138  135 - 145 (mEq/L)   Potassium 4.0  3.5 - 5.1 (mEq/L)   Chloride 105  96 - 112 (mEq/L)   CO2 29  19 - 32 (mEq/L)   Glucose, Bld 104 (*) 70 - 99 (mg/dL)   BUN 7  6 - 23 (mg/dL)   Creatinine, Ser 5.78  0.50 - 1.35 (mg/dL)   Calcium 8.9  8.4 - 46.9 (mg/dL)   GFR calc non Af Amer >90  >90 (mL/min)   GFR calc Af Amer >90  >90 (mL/min)    Exam: Neurologically intact ABD soft Sensation intact distally Compartment soft clean, dry Motor function intact - moving foot and toes well on exam.   Assessment/Plan: 1 Day Post-Op Procedure(s) (LRB): LUMBAR LAMINECTOMY/DECOMPRESSION MICRODISCECTOMY (N/A)  Procedure(s) (LRB): LUMBAR  LAMINECTOMY/DECOMPRESSION MICRODISCECTOMY (N/A) Past Medical History  Diagnosis Date  . Complication of anesthesia     states a smaller tube has been used in the past due to sore throat  . Hypertension     EKG PCP 02/05/11 on chart    Discharge home Discussed d/c instructions ASA to start tomorrow for DVT prevention F/u JCB in 14 days   Lanea Vankirk R. 03/06/2011, 10:13 AM

## 2011-03-06 NOTE — Progress Notes (Signed)
Physical Therapy Evaluation Patient Details Name: Lance King MRN: [id number] DOB: 12-11-1950 Today's Date: 03/06/2011  Problem List: There is no problem list on file for this patient.  [id number] Ev1 Past Medical History:  Past Medical History  Diagnosis Date  . Complication of anesthesia     states a smaller tube has been used in the past due to sore throat  . Hypertension     EKG PCP 02/05/11 on chart   Past Surgical History:  Past Surgical History  Procedure Date  . Back surgery     Lumbar decompression 1994  . Knee arthroscopy     1987    PT Assessment/Plan/Recommendation PT Assessment Clinical Impression Statement: pt s/p decompression, mobilizing very well, reviewed back precautions with pt, pt acknowledged. no further skilled PT needs at this time. Pt is hopeful for DC to day. PT Recommendation/Assessment: Patent does not need any further PT services No Skilled PT: Patient is supervision for all activity/mobility;All education completed PT Recommendation Follow Up Recommendations: None;24 hour supervision/assistance Equipment Recommended: None recommended by OT PT Goals     PT Evaluation Precautions/Restrictions  Precautions Precautions: Back Required Braces or Orthoses: No Restrictions Weight Bearing Restrictions: No Prior Functioning  Home Living Lives With: Spouse Receives Help From: Family Type of Home: House Home Layout: One level Home Access: Stairs to enter Entrance Stairs-Rails: Right Entrance Stairs-Number of Steps: 3 Bathroom Shower/Tub: Engineer, manufacturing systems: Handicapped height Bathroom Accessibility: Yes Home Adaptive Equipment: None Prior Function Level of Independence: Independent with basic ADLs;Independent with transfers;Independent with homemaking with wheelchair Vocation: Retired Financial risk analyst Arousal/Alertness: Awake/alert Overall Cognitive Status: Appears within functional limits for tasks assessed Orientation  Level: Oriented X4 Sensation/Coordination Sensation Light Touch: Appears Intact Coordination Gross Motor Movements are Fluid and Coordinated: Yes Fine Motor Movements are Fluid and Coordinated: Yes Extremity Assessment RUE Assessment RUE Assessment: Within Functional Limits LUE Assessment LUE Assessment: Within Functional Limits RLE Assessment RLE Assessment: Within Functional Limits LLE Assessment LLE Assessment: Within Functional Limits Mobility (including Balance) Bed Mobility Bed Mobility: Yes Rolling Right: 5: Supervision Supine to Sit: 5: Supervision Transfers Sit to Stand: 5: Supervision Ambulation/Gait Ambulation/Gait: Yes Ambulation/Gait Assistance: 5: Supervision (min guard) Ambulation/Gait Assistance Details (indicate cue type and reason): pt walks slowly, is guarded Ambulation Distance (Feet): 200 Feet Assistive device: None Gait Pattern: Step-through pattern Gait velocity: slow Stairs: No  Posture/Postural Control Posture/Postural Control: Postural limitations Postural Limitations: pt holds UEs and trunk guardedly Exercise    End of Session PT - End of Session Activity Tolerance: Patient tolerated treatment well Patient left: in chair;with call bell in reach General Behavior During Session: [facility] for tasks performed Cognition: [facility] for tasks performed  Rada Hay 03/06/2011, 9:34 AM

## 2011-03-07 ENCOUNTER — Encounter (HOSPITAL_COMMUNITY): Payer: Self-pay | Admitting: Specialist

## 2012-10-28 IMAGING — CR DG SPINE 1V PORT
1 series · 1 of 1 positions shown · non-contrast
Comparison: None.

CLINICAL DATA: Surgical decompression at L3-L4 and L4-L5.

DG SPINE PORTABLE - 1 VIEW

[lateral]
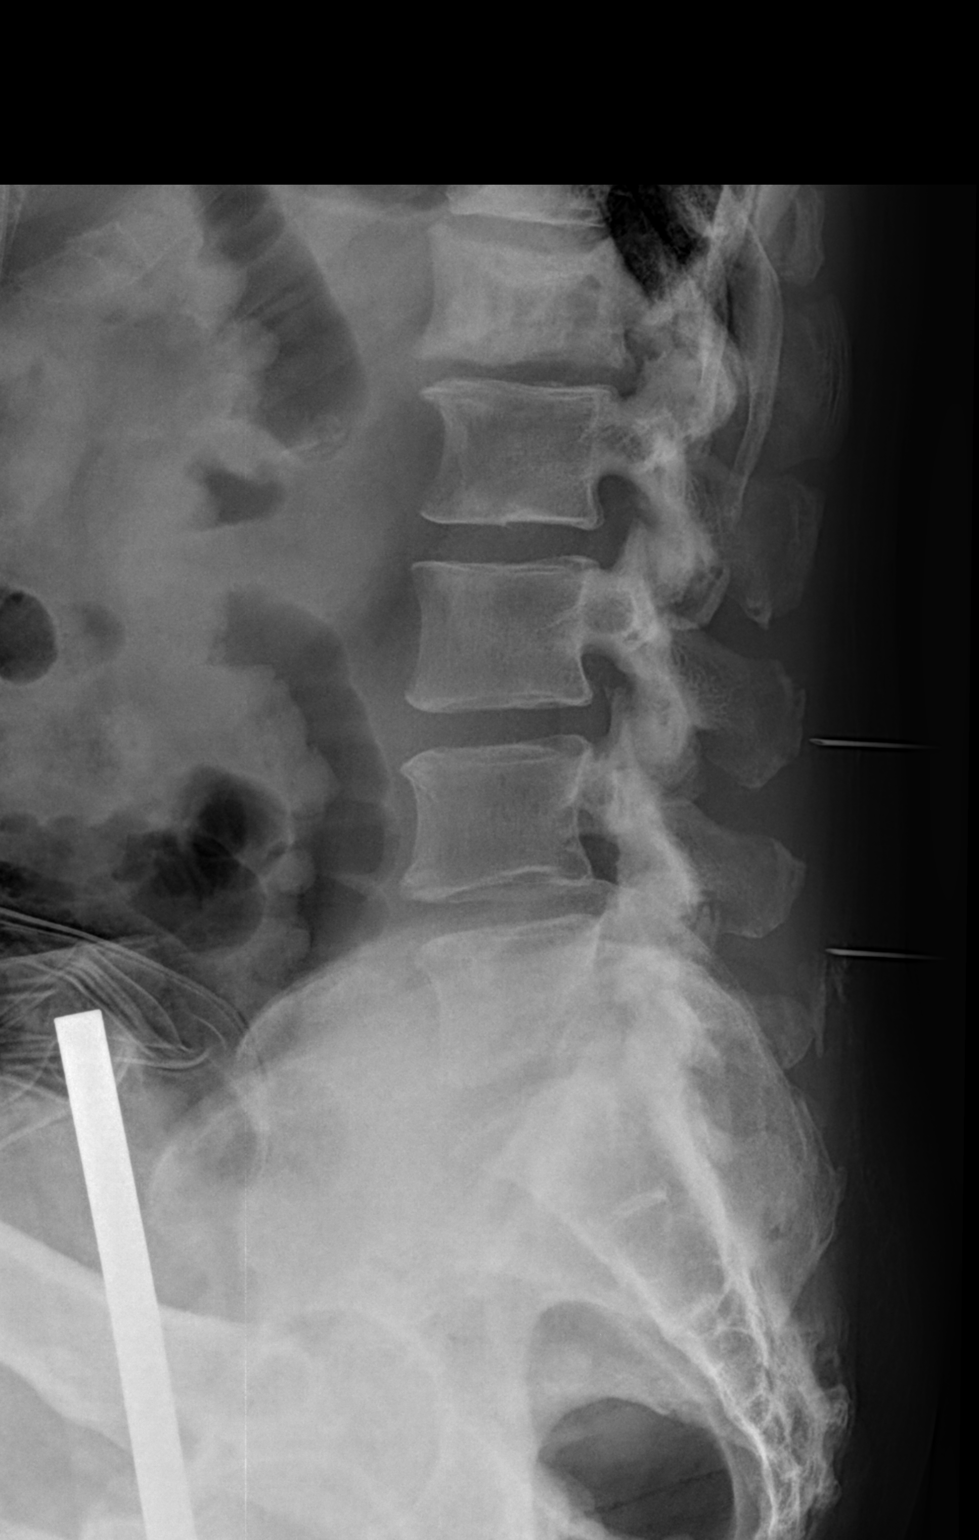

[1 of 1 positions shown; findings below may reference images not displayed]

FINDINGS: Five non-rib bearing lumbar type vertebral bodies.
Surgical devices project posterior to the L3-L4 and upper L5
levels.  There is mild L1 compression deformity with mild ventral
canal encroachment.  Suboptimally evaluated.  Degenerative disc
disease at the lumbosacral junction with facet arthropathy.
IMPRESSION: Surgical devices projecting posterior to the L3-L4 and upper L5
levels.

L1 compression deformity.

## 2016-04-18 DIAGNOSIS — I10 Essential (primary) hypertension: Secondary | ICD-10-CM | POA: Diagnosis not present

## 2016-04-18 DIAGNOSIS — R05 Cough: Secondary | ICD-10-CM | POA: Diagnosis not present

## 2016-04-18 DIAGNOSIS — R609 Edema, unspecified: Secondary | ICD-10-CM | POA: Diagnosis not present

## 2016-04-21 DIAGNOSIS — J069 Acute upper respiratory infection, unspecified: Secondary | ICD-10-CM | POA: Diagnosis not present

## 2016-05-05 DIAGNOSIS — Z6831 Body mass index (BMI) 31.0-31.9, adult: Secondary | ICD-10-CM | POA: Diagnosis not present

## 2016-05-05 DIAGNOSIS — R05 Cough: Secondary | ICD-10-CM | POA: Diagnosis not present

## 2016-05-19 DIAGNOSIS — I10 Essential (primary) hypertension: Secondary | ICD-10-CM | POA: Diagnosis not present

## 2016-05-19 DIAGNOSIS — Z6831 Body mass index (BMI) 31.0-31.9, adult: Secondary | ICD-10-CM | POA: Diagnosis not present

## 2016-06-16 DIAGNOSIS — R609 Edema, unspecified: Secondary | ICD-10-CM | POA: Diagnosis not present

## 2016-06-16 DIAGNOSIS — Z6829 Body mass index (BMI) 29.0-29.9, adult: Secondary | ICD-10-CM | POA: Diagnosis not present

## 2016-06-16 DIAGNOSIS — I10 Essential (primary) hypertension: Secondary | ICD-10-CM | POA: Diagnosis not present

## 2016-07-07 DIAGNOSIS — N529 Male erectile dysfunction, unspecified: Secondary | ICD-10-CM | POA: Diagnosis not present

## 2016-07-07 DIAGNOSIS — R972 Elevated prostate specific antigen [PSA]: Secondary | ICD-10-CM | POA: Diagnosis not present

## 2016-07-07 DIAGNOSIS — N401 Enlarged prostate with lower urinary tract symptoms: Secondary | ICD-10-CM | POA: Diagnosis not present

## 2016-11-24 DIAGNOSIS — H18413 Arcus senilis, bilateral: Secondary | ICD-10-CM | POA: Diagnosis not present

## 2016-11-24 DIAGNOSIS — H04123 Dry eye syndrome of bilateral lacrimal glands: Secondary | ICD-10-CM | POA: Diagnosis not present

## 2016-11-24 DIAGNOSIS — H52223 Regular astigmatism, bilateral: Secondary | ICD-10-CM | POA: Diagnosis not present

## 2016-12-17 DIAGNOSIS — Z1389 Encounter for screening for other disorder: Secondary | ICD-10-CM | POA: Diagnosis not present

## 2016-12-17 DIAGNOSIS — Z6829 Body mass index (BMI) 29.0-29.9, adult: Secondary | ICD-10-CM | POA: Diagnosis not present

## 2016-12-17 DIAGNOSIS — Z139 Encounter for screening, unspecified: Secondary | ICD-10-CM | POA: Diagnosis not present

## 2016-12-17 DIAGNOSIS — Z0000 Encounter for general adult medical examination without abnormal findings: Secondary | ICD-10-CM | POA: Diagnosis not present

## 2016-12-17 DIAGNOSIS — Z7189 Other specified counseling: Secondary | ICD-10-CM | POA: Diagnosis not present

## 2016-12-17 DIAGNOSIS — I10 Essential (primary) hypertension: Secondary | ICD-10-CM | POA: Diagnosis not present

## 2016-12-17 DIAGNOSIS — N62 Hypertrophy of breast: Secondary | ICD-10-CM | POA: Diagnosis not present

## 2016-12-25 DIAGNOSIS — Z86010 Personal history of colonic polyps: Secondary | ICD-10-CM | POA: Diagnosis not present

## 2016-12-25 DIAGNOSIS — Z1211 Encounter for screening for malignant neoplasm of colon: Secondary | ICD-10-CM | POA: Diagnosis not present

## 2016-12-31 DIAGNOSIS — Z23 Encounter for immunization: Secondary | ICD-10-CM | POA: Diagnosis not present

## 2016-12-31 DIAGNOSIS — I10 Essential (primary) hypertension: Secondary | ICD-10-CM | POA: Diagnosis not present

## 2016-12-31 DIAGNOSIS — N62 Hypertrophy of breast: Secondary | ICD-10-CM | POA: Diagnosis not present

## 2016-12-31 DIAGNOSIS — Z6830 Body mass index (BMI) 30.0-30.9, adult: Secondary | ICD-10-CM | POA: Diagnosis not present

## 2017-01-08 DIAGNOSIS — R972 Elevated prostate specific antigen [PSA]: Secondary | ICD-10-CM | POA: Diagnosis not present

## 2017-01-08 DIAGNOSIS — N401 Enlarged prostate with lower urinary tract symptoms: Secondary | ICD-10-CM | POA: Diagnosis not present

## 2017-01-09 DIAGNOSIS — I10 Essential (primary) hypertension: Secondary | ICD-10-CM | POA: Diagnosis not present

## 2017-01-09 DIAGNOSIS — Z79899 Other long term (current) drug therapy: Secondary | ICD-10-CM | POA: Diagnosis not present

## 2017-01-09 DIAGNOSIS — K635 Polyp of colon: Secondary | ICD-10-CM | POA: Diagnosis not present

## 2017-01-09 DIAGNOSIS — Z86010 Personal history of colonic polyps: Secondary | ICD-10-CM | POA: Diagnosis not present

## 2017-01-09 DIAGNOSIS — K649 Unspecified hemorrhoids: Secondary | ICD-10-CM | POA: Diagnosis not present

## 2017-01-09 DIAGNOSIS — Q438 Other specified congenital malformations of intestine: Secondary | ICD-10-CM | POA: Diagnosis not present

## 2017-01-09 DIAGNOSIS — K648 Other hemorrhoids: Secondary | ICD-10-CM | POA: Diagnosis not present

## 2017-01-09 DIAGNOSIS — Z1211 Encounter for screening for malignant neoplasm of colon: Secondary | ICD-10-CM | POA: Diagnosis not present

## 2017-01-09 DIAGNOSIS — D122 Benign neoplasm of ascending colon: Secondary | ICD-10-CM | POA: Diagnosis not present

## 2017-01-09 DIAGNOSIS — K5730 Diverticulosis of large intestine without perforation or abscess without bleeding: Secondary | ICD-10-CM | POA: Diagnosis not present

## 2017-01-09 HISTORY — PX: COLONOSCOPY: SHX174

## 2017-07-09 DIAGNOSIS — R972 Elevated prostate specific antigen [PSA]: Secondary | ICD-10-CM | POA: Diagnosis not present

## 2017-07-09 DIAGNOSIS — N401 Enlarged prostate with lower urinary tract symptoms: Secondary | ICD-10-CM | POA: Diagnosis not present

## 2017-10-30 DIAGNOSIS — M545 Low back pain: Secondary | ICD-10-CM | POA: Diagnosis not present

## 2017-11-02 DIAGNOSIS — M5417 Radiculopathy, lumbosacral region: Secondary | ICD-10-CM | POA: Diagnosis not present

## 2017-11-02 DIAGNOSIS — M545 Low back pain: Secondary | ICD-10-CM | POA: Diagnosis not present

## 2017-11-09 DIAGNOSIS — M545 Low back pain: Secondary | ICD-10-CM | POA: Diagnosis not present

## 2017-11-09 DIAGNOSIS — M5417 Radiculopathy, lumbosacral region: Secondary | ICD-10-CM | POA: Diagnosis not present

## 2017-11-17 DIAGNOSIS — M545 Low back pain: Secondary | ICD-10-CM | POA: Diagnosis not present

## 2017-11-24 DIAGNOSIS — M881 Osteitis deformans of vertebrae: Secondary | ICD-10-CM | POA: Diagnosis not present

## 2017-11-24 DIAGNOSIS — M5136 Other intervertebral disc degeneration, lumbar region: Secondary | ICD-10-CM | POA: Diagnosis not present

## 2017-11-24 DIAGNOSIS — M1711 Unilateral primary osteoarthritis, right knee: Secondary | ICD-10-CM | POA: Diagnosis not present

## 2017-11-24 DIAGNOSIS — M545 Low back pain: Secondary | ICD-10-CM | POA: Diagnosis not present

## 2017-11-24 DIAGNOSIS — M5126 Other intervertebral disc displacement, lumbar region: Secondary | ICD-10-CM | POA: Diagnosis not present

## 2017-12-02 DIAGNOSIS — H04123 Dry eye syndrome of bilateral lacrimal glands: Secondary | ICD-10-CM | POA: Diagnosis not present

## 2017-12-02 DIAGNOSIS — H5201 Hypermetropia, right eye: Secondary | ICD-10-CM | POA: Diagnosis not present

## 2017-12-02 DIAGNOSIS — H18413 Arcus senilis, bilateral: Secondary | ICD-10-CM | POA: Diagnosis not present

## 2017-12-08 DIAGNOSIS — M5416 Radiculopathy, lumbar region: Secondary | ICD-10-CM | POA: Diagnosis not present

## 2017-12-22 DIAGNOSIS — M545 Low back pain: Secondary | ICD-10-CM | POA: Diagnosis not present

## 2017-12-22 DIAGNOSIS — M25561 Pain in right knee: Secondary | ICD-10-CM | POA: Diagnosis not present

## 2017-12-30 DIAGNOSIS — M1711 Unilateral primary osteoarthritis, right knee: Secondary | ICD-10-CM | POA: Diagnosis not present

## 2018-01-08 DIAGNOSIS — N401 Enlarged prostate with lower urinary tract symptoms: Secondary | ICD-10-CM | POA: Diagnosis not present

## 2018-01-08 DIAGNOSIS — R972 Elevated prostate specific antigen [PSA]: Secondary | ICD-10-CM | POA: Diagnosis not present

## 2018-01-11 DIAGNOSIS — M1711 Unilateral primary osteoarthritis, right knee: Secondary | ICD-10-CM | POA: Diagnosis not present

## 2018-01-13 DIAGNOSIS — M25511 Pain in right shoulder: Secondary | ICD-10-CM | POA: Diagnosis not present

## 2018-01-13 DIAGNOSIS — M25561 Pain in right knee: Secondary | ICD-10-CM | POA: Diagnosis not present

## 2018-01-13 DIAGNOSIS — M25611 Stiffness of right shoulder, not elsewhere classified: Secondary | ICD-10-CM | POA: Diagnosis not present

## 2018-01-15 DIAGNOSIS — M25511 Pain in right shoulder: Secondary | ICD-10-CM | POA: Diagnosis not present

## 2018-01-15 DIAGNOSIS — Z23 Encounter for immunization: Secondary | ICD-10-CM | POA: Diagnosis not present

## 2018-01-15 DIAGNOSIS — Z139 Encounter for screening, unspecified: Secondary | ICD-10-CM | POA: Diagnosis not present

## 2018-01-15 DIAGNOSIS — M25611 Stiffness of right shoulder, not elsewhere classified: Secondary | ICD-10-CM | POA: Diagnosis not present

## 2018-01-15 DIAGNOSIS — M25561 Pain in right knee: Secondary | ICD-10-CM | POA: Diagnosis not present

## 2018-01-15 DIAGNOSIS — Z0000 Encounter for general adult medical examination without abnormal findings: Secondary | ICD-10-CM | POA: Diagnosis not present

## 2018-01-15 DIAGNOSIS — I10 Essential (primary) hypertension: Secondary | ICD-10-CM | POA: Diagnosis not present

## 2018-01-15 DIAGNOSIS — Z1331 Encounter for screening for depression: Secondary | ICD-10-CM | POA: Diagnosis not present

## 2018-01-18 DIAGNOSIS — M25511 Pain in right shoulder: Secondary | ICD-10-CM | POA: Diagnosis not present

## 2018-01-18 DIAGNOSIS — M25611 Stiffness of right shoulder, not elsewhere classified: Secondary | ICD-10-CM | POA: Diagnosis not present

## 2018-01-18 DIAGNOSIS — M25561 Pain in right knee: Secondary | ICD-10-CM | POA: Diagnosis not present

## 2018-01-20 DIAGNOSIS — M25511 Pain in right shoulder: Secondary | ICD-10-CM | POA: Diagnosis not present

## 2018-01-20 DIAGNOSIS — M25611 Stiffness of right shoulder, not elsewhere classified: Secondary | ICD-10-CM | POA: Diagnosis not present

## 2018-01-20 DIAGNOSIS — M25561 Pain in right knee: Secondary | ICD-10-CM | POA: Diagnosis not present

## 2018-01-25 DIAGNOSIS — M25561 Pain in right knee: Secondary | ICD-10-CM | POA: Diagnosis not present

## 2018-01-25 DIAGNOSIS — M25611 Stiffness of right shoulder, not elsewhere classified: Secondary | ICD-10-CM | POA: Diagnosis not present

## 2018-01-25 DIAGNOSIS — M25511 Pain in right shoulder: Secondary | ICD-10-CM | POA: Diagnosis not present

## 2018-01-27 DIAGNOSIS — M25561 Pain in right knee: Secondary | ICD-10-CM | POA: Diagnosis not present

## 2018-01-27 DIAGNOSIS — M25611 Stiffness of right shoulder, not elsewhere classified: Secondary | ICD-10-CM | POA: Diagnosis not present

## 2018-01-27 DIAGNOSIS — M25511 Pain in right shoulder: Secondary | ICD-10-CM | POA: Diagnosis not present

## 2018-02-01 DIAGNOSIS — M25511 Pain in right shoulder: Secondary | ICD-10-CM | POA: Diagnosis not present

## 2018-02-01 DIAGNOSIS — M881 Osteitis deformans of vertebrae: Secondary | ICD-10-CM | POA: Diagnosis not present

## 2018-02-01 DIAGNOSIS — M7501 Adhesive capsulitis of right shoulder: Secondary | ICD-10-CM | POA: Diagnosis not present

## 2018-02-01 DIAGNOSIS — M1711 Unilateral primary osteoarthritis, right knee: Secondary | ICD-10-CM | POA: Diagnosis not present

## 2018-02-01 DIAGNOSIS — M5416 Radiculopathy, lumbar region: Secondary | ICD-10-CM | POA: Diagnosis not present

## 2018-02-03 DIAGNOSIS — N401 Enlarged prostate with lower urinary tract symptoms: Secondary | ICD-10-CM | POA: Diagnosis not present

## 2018-02-03 DIAGNOSIS — R972 Elevated prostate specific antigen [PSA]: Secondary | ICD-10-CM | POA: Diagnosis not present

## 2018-02-08 DIAGNOSIS — M25511 Pain in right shoulder: Secondary | ICD-10-CM | POA: Diagnosis not present

## 2018-02-08 DIAGNOSIS — M25561 Pain in right knee: Secondary | ICD-10-CM | POA: Diagnosis not present

## 2018-02-08 DIAGNOSIS — M25611 Stiffness of right shoulder, not elsewhere classified: Secondary | ICD-10-CM | POA: Diagnosis not present

## 2018-02-10 DIAGNOSIS — M25561 Pain in right knee: Secondary | ICD-10-CM | POA: Diagnosis not present

## 2018-02-10 DIAGNOSIS — N401 Enlarged prostate with lower urinary tract symptoms: Secondary | ICD-10-CM | POA: Diagnosis not present

## 2018-02-10 DIAGNOSIS — M25611 Stiffness of right shoulder, not elsewhere classified: Secondary | ICD-10-CM | POA: Diagnosis not present

## 2018-02-10 DIAGNOSIS — R972 Elevated prostate specific antigen [PSA]: Secondary | ICD-10-CM | POA: Diagnosis not present

## 2018-02-10 DIAGNOSIS — M25511 Pain in right shoulder: Secondary | ICD-10-CM | POA: Diagnosis not present

## 2018-02-15 DIAGNOSIS — M25611 Stiffness of right shoulder, not elsewhere classified: Secondary | ICD-10-CM | POA: Diagnosis not present

## 2018-02-15 DIAGNOSIS — M25561 Pain in right knee: Secondary | ICD-10-CM | POA: Diagnosis not present

## 2018-02-15 DIAGNOSIS — M25511 Pain in right shoulder: Secondary | ICD-10-CM | POA: Diagnosis not present

## 2018-02-18 DIAGNOSIS — M25511 Pain in right shoulder: Secondary | ICD-10-CM | POA: Diagnosis not present

## 2018-02-18 DIAGNOSIS — M25611 Stiffness of right shoulder, not elsewhere classified: Secondary | ICD-10-CM | POA: Diagnosis not present

## 2018-02-18 DIAGNOSIS — M25561 Pain in right knee: Secondary | ICD-10-CM | POA: Diagnosis not present

## 2018-02-23 DIAGNOSIS — M25511 Pain in right shoulder: Secondary | ICD-10-CM | POA: Diagnosis not present

## 2018-02-23 DIAGNOSIS — M25561 Pain in right knee: Secondary | ICD-10-CM | POA: Diagnosis not present

## 2018-02-23 DIAGNOSIS — M25611 Stiffness of right shoulder, not elsewhere classified: Secondary | ICD-10-CM | POA: Diagnosis not present

## 2018-03-01 DIAGNOSIS — M25561 Pain in right knee: Secondary | ICD-10-CM | POA: Diagnosis not present

## 2018-03-01 DIAGNOSIS — M25511 Pain in right shoulder: Secondary | ICD-10-CM | POA: Diagnosis not present

## 2018-03-01 DIAGNOSIS — M25611 Stiffness of right shoulder, not elsewhere classified: Secondary | ICD-10-CM | POA: Diagnosis not present

## 2018-03-03 DIAGNOSIS — M25611 Stiffness of right shoulder, not elsewhere classified: Secondary | ICD-10-CM | POA: Diagnosis not present

## 2018-03-03 DIAGNOSIS — M25511 Pain in right shoulder: Secondary | ICD-10-CM | POA: Diagnosis not present

## 2018-03-03 DIAGNOSIS — M25561 Pain in right knee: Secondary | ICD-10-CM | POA: Diagnosis not present

## 2018-03-08 DIAGNOSIS — M25511 Pain in right shoulder: Secondary | ICD-10-CM | POA: Diagnosis not present

## 2018-03-08 DIAGNOSIS — M25611 Stiffness of right shoulder, not elsewhere classified: Secondary | ICD-10-CM | POA: Diagnosis not present

## 2018-03-08 DIAGNOSIS — M25561 Pain in right knee: Secondary | ICD-10-CM | POA: Diagnosis not present

## 2018-03-10 DIAGNOSIS — M25511 Pain in right shoulder: Secondary | ICD-10-CM | POA: Diagnosis not present

## 2018-03-10 DIAGNOSIS — M25611 Stiffness of right shoulder, not elsewhere classified: Secondary | ICD-10-CM | POA: Diagnosis not present

## 2018-03-10 DIAGNOSIS — M25561 Pain in right knee: Secondary | ICD-10-CM | POA: Diagnosis not present

## 2018-03-30 DIAGNOSIS — M881 Osteitis deformans of vertebrae: Secondary | ICD-10-CM | POA: Diagnosis not present

## 2018-04-01 NOTE — Progress Notes (Signed)
Office Visit Note  Patient: Lance King             Date of Birth: 1950/04/20           MRN: [id number]             PCP: Maryella Shivers, MD Referring: Susa Day, MD Visit Date: 04/06/2018 Occupation: retired, Education officer, museum  Subjective:  Paget's disease.   History of Present Illness: Lance King is a 68 y.o. male seen in consultation per request of Dr. Ardyth Gal.  According to patient he has had history of lower back pain for many years.  He states in 1990 there was a suspicion of a bony lesion on his spine for which she had bone biopsy which was negative.  He had surgery on his lumbar spine twice for herniated disc.  He states the last surgery was in 2012.  He has been also diagnosed with osteoarthritis of his right knee joint.  He has had recurrent problems with right shoulder adhesive capsulitis for which she has been seeing Dr. Maxie Better.  He states recently he has been having pain in his right lower extremity for which he had MRI of his lumbar spine.  The MRI revealed Paget's disease in his lumbar spine.  He was referred to me for further evaluation and treatment of Paget's disease.  Activities of Daily Living:  Patient reports morning stiffness for 0 minute.   Patient Denies nocturnal pain.  Difficulty dressing/grooming: Denies Difficulty climbing stairs: Denies Difficulty getting out of chair: Denies Difficulty using hands for taps, buttons, cutlery, and/or writing: Denies  Review of Systems  Constitutional: Negative for fatigue and night sweats.  HENT: Negative for mouth sores, mouth dryness and nose dryness.   Eyes: Positive for dryness. Negative for redness.       After the cataract surgery  Respiratory: Negative for shortness of breath and difficulty breathing.   Cardiovascular: Negative for chest pain, palpitations, hypertension, irregular heartbeat and swelling in legs/feet.  Gastrointestinal: Negative for constipation and diarrhea.  Endocrine:  Negative for increased urination.  Musculoskeletal: Negative for arthralgias, joint pain, joint swelling, myalgias, muscle weakness, morning stiffness, muscle tenderness and myalgias.  Skin: Negative for color change, rash, hair loss, nodules/bumps, skin tightness, ulcers and sensitivity to sunlight.  Allergic/Immunologic: Negative for susceptible to infections.  Neurological: Negative for dizziness, fainting, memory loss, night sweats and weakness ( ).  Hematological: Negative for swollen glands.  Psychiatric/Behavioral: Negative for depressed mood and sleep disturbance. The patient is not nervous/anxious.     PMFS History:  Patient Active Problem List   Diagnosis Date Noted  . DDD (degenerative disc disease), lumbar 04/06/2018  . Primary osteoarthritis of right knee 04/06/2018  . Adhesive capsulitis of right shoulder 04/06/2018  . Essential hypertension 04/06/2018    Past Medical History:  Diagnosis Date  . Complication of anesthesia    states a smaller tube has been used in the past due to sore throat  . Hypertension    EKG PCP 02/05/11 on chart    Family History  Problem Relation Age of Onset  . Hypertension Mother   . Hypertension Father    Past Surgical History:  Procedure Laterality Date  . BACK SURGERY     Lumbar decompression 1994  . KNEE ARTHROSCOPY     1987  . LUMBAR LAMINECTOMY/DECOMPRESSION MICRODISCECTOMY  03/05/2011   Procedure: LUMBAR LAMINECTOMY/DECOMPRESSION MICRODISCECTOMY;  Surgeon: Johnn Hai;  Location: [facility];  Service: Orthopedics;  Laterality: N/A;  Re do  Decompression L3-L5  (X-Ray)    Central   Social History   Social History Narrative  . Not on file    Objective: Vital Signs: BP (!) 144/81 (BP Location: Right Arm, Patient Position: Sitting, Cuff Size: Large)   Pulse 60   Resp 13   Ht 5' 7.5" (1.715 m)   Wt 195 lb 6.4 oz (88.6 kg)   BMI 30.15 kg/m    Physical Exam Vitals signs and nursing note reviewed.  Constitutional:       Appearance: He is well-developed.  HENT:     Head: Normocephalic and atraumatic.  Eyes:     Conjunctiva/sclera: Conjunctivae normal.     Pupils: Pupils are equal, round, and reactive to light.  Neck:     Musculoskeletal: Normal range of motion and neck supple.  Cardiovascular:     Rate and Rhythm: Normal rate and regular rhythm.     Heart sounds: Normal heart sounds.  Pulmonary:     Effort: Pulmonary effort is normal.     Breath sounds: Normal breath sounds.  Abdominal:     General: Bowel sounds are normal.     Palpations: Abdomen is soft.  Skin:    General: Skin is warm and dry.     Capillary Refill: Capillary refill takes less than 2 seconds.  Neurological:     Mental Status: He is alert and oriented to person, place, and time.  Psychiatric:        Behavior: Behavior normal.      Musculoskeletal Exam: C-spine good range of motion.  He has limited range of motion of thoracic and lumbar spine.  He has postsurgical changes on the lumbar spine.  Shoulder joints elbow joints wrist joints MCPs PIPs DIPs been good range of motion with no synovitis.  Hip joints knee joints ankles MTPs PIPs with good range of motion with no synovitis.  CDAI Exam: CDAI Score: Not documented Patient Global Assessment: Not documented; Provider Global Assessment: Not documented Swollen: Not documented; Tender: Not documented Joint Exam   Not documented   There is currently no information documented on the homunculus. Go to the Rheumatology activity and complete the homunculus joint exam.  Investigation: Findings:  November 25, 2017 MRI of lumbar spine showed interval increased expansion of L1 vertebral body and posterior process with heterogeneous marrow edema and sclerosis with appearance most compatible with Paget's disease.  Mild to moderate spinal canal stenosis spanning L1 body level showed progression.  Postsurgical changes were noted at L3-4 and L4-5 with mild central stenosis and severe bilateral  subarticular zone narrowing.  Minimal anterolisthesis of L4 on L5.  Results were read by Dr. Collene Schlichter    Imaging: No results found.  Recent Labs: Lab Results  Component Value Date   WBC 8.6 03/06/2011   HGB 12.6 (L) 03/06/2011   PLT 247 03/06/2011   NA 138 03/06/2011   K 4.0 03/06/2011   CL 105 03/06/2011   CO2 29 03/06/2011   GLUCOSE 104 (H) 03/06/2011   BUN 7 03/06/2011   CREATININE 0.90 03/06/2011   BILITOT 0.8 03/03/2011   ALKPHOS 167 (H) 03/03/2011   AST 23 03/03/2011   ALT 34 03/03/2011   PROT 6.9 03/03/2011   ALBUMIN 3.9 03/03/2011   CALCIUM 8.9 03/06/2011   GFRAA >90 03/06/2011    Speciality Comments: No specialty comments available.  Procedures:  No procedures performed Allergies: Patient has no known allergies.   Assessment / Plan:     Visit Diagnoses: Paget's disease of  bone -the MRI of the lumbar spine done by Dr. Maxie Better showed some changes consistent with Paget's disease.  I will obtain total body bone scan to evaluate this further to see the extent of the disease.  It would be also beneficial in the monitoring process in the future.  I saw some labs from 2012 his alkaline phosphatase was elevated back then as well.  His disease is asymptomatic although because of the location he would benefit from the treatment.  As it imposes increased risk of vertebral fractures and complications down the road.  Different treatment options and their side effects were discussed.  My plan is to start him on zoledronic acid once the labs and total body bone scan results are available.  A handout on zoledronic acid was given.  Lumbar spine - Plan: CBC with Differential/Platelet, COMPLETE METABOLIC PANEL WITH GFR, Sedimentation rate, Parathyroid hormone, intact (no Ca), VITAMIN D 25 Hydroxy (Vit-D Deficiency, Fractures), Phosphorus, Serum protein electrophoresis with reflex  History of back pain-related to degenerative disc disease of lumbar spine.  He had 2 lumbar spine  surgeries in the past.  Primary osteoarthritis of right knee-he is currently not having many symptoms.  Adhesive capsulitis of right shoulder - 2-3wk x3-4 weeksevaluated by Dr. Tonita Cong  Essential hypertension -his blood pressure is elevated.  Have advised him to monitor blood pressure closely.  Orders: Orders Placed This Encounter  Procedures  . NM Bone Scan Whole Body  . CBC with Differential/Platelet  . COMPLETE METABOLIC PANEL WITH GFR  . Sedimentation rate  . Parathyroid hormone, intact (no Ca)  . VITAMIN D 25 Hydroxy (Vit-D Deficiency, Fractures)  . Phosphorus  . Serum protein electrophoresis with reflex   No orders of the defined types were placed in this encounter.   Face-to-face time spent with patient was 50 minutes. Greater than 50% of time was spent in counseling and coordination of care.  Follow-Up Instructions: Return for Paget's disease.   Bo Merino, MD  Note - This record has been created using Editor, commissioning.  Chart creation errors have been sought, but may not always  have been located. Such creation errors do not reflect on  the standard of medical care.

## 2018-04-06 ENCOUNTER — Encounter: Payer: Self-pay | Admitting: Rheumatology

## 2018-04-06 ENCOUNTER — Ambulatory Visit: Payer: PPO | Admitting: Rheumatology

## 2018-04-06 VITALS — BP 144/81 | HR 60 | Resp 13 | Ht 67.5 in | Wt 195.4 lb

## 2018-04-06 DIAGNOSIS — Z8739 Personal history of other diseases of the musculoskeletal system and connective tissue: Secondary | ICD-10-CM

## 2018-04-06 DIAGNOSIS — M889 Osteitis deformans of unspecified bone: Secondary | ICD-10-CM | POA: Diagnosis not present

## 2018-04-06 DIAGNOSIS — M5136 Other intervertebral disc degeneration, lumbar region: Secondary | ICD-10-CM | POA: Insufficient documentation

## 2018-04-06 DIAGNOSIS — R748 Abnormal levels of other serum enzymes: Secondary | ICD-10-CM | POA: Diagnosis not present

## 2018-04-06 DIAGNOSIS — M7501 Adhesive capsulitis of right shoulder: Secondary | ICD-10-CM | POA: Diagnosis not present

## 2018-04-06 DIAGNOSIS — R899 Unspecified abnormal finding in specimens from other organs, systems and tissues: Secondary | ICD-10-CM | POA: Diagnosis not present

## 2018-04-06 DIAGNOSIS — I10 Essential (primary) hypertension: Secondary | ICD-10-CM | POA: Diagnosis not present

## 2018-04-06 DIAGNOSIS — M1711 Unilateral primary osteoarthritis, right knee: Secondary | ICD-10-CM | POA: Diagnosis not present

## 2018-04-06 NOTE — Progress Notes (Signed)
Pharmacy Note  Subjective: Patient presents today to the [facility] to see Dr. Estanislado Pandy.  Patient seen by pharmacist for counseling on bisphosphonate therapy for Paget's Disease.  Objective: Calcium: pending 04/06/18 Vitamin D: pending 04/06/18 IWL:[id number] 04/06/18 Phosphorus:pending 04/06/18  Assessment/Plan: Counseled patient that Reclast is an IV bisphosphonate that reduces bone turnover by inhibiting osteoclasts that chew up bone.  Counseled patient on purpose, proper use, and adverse effects. o  Reviewed importance of taking calcium and vitamin D with bisphosphonate therapy.  Patient confirms she is already taking calcium/vitamin D.  Provided patient with medication education material and answered all questions.  Reviewed adverse events of Reclast including risk of headache, and muscle & bone pain.  Reviewed rare adverse effect of osteonecrosis of the jaw and advised patient to alert her dentist that she is on bisphosphonate prior to any major dental work.  Patient confirms she does not have any major dental work scheduled at this time.  Patient agrees to trial of Reclast at this time.     Order pending lab results, whole body bone scan, and insurance approval.  All questions encouraged and answered.  Instructed patient to call with any further questions or concerns.  Mariella Saa, PharmD, [facility] Clinical Pharmacist  04/06/2018 11:46 AM

## 2018-04-07 NOTE — Progress Notes (Signed)
Please call in vitamin D 50,000 units twice a week for 3 months and repeat level in 3 months.

## 2018-04-08 ENCOUNTER — Telehealth: Payer: Self-pay | Admitting: *Deleted

## 2018-04-08 DIAGNOSIS — E559 Vitamin D deficiency, unspecified: Secondary | ICD-10-CM

## 2018-04-08 MED ORDER — VITAMIN D (ERGOCALCIFEROL) 1.25 MG (50000 UNIT) PO CAPS: 50000.0000 [IU] | ORAL_CAPSULE | ORAL | 0 refills | Status: DC

## 2018-04-08 NOTE — Telephone Encounter (Signed)
-----   Message from Bo Merino, MD sent at 04/07/2018 12:21 PM EST ----- Please call in vitamin D 50,000 units twice a week for 3 months and repeat level in 3 months.

## 2018-04-13 LAB — SEDIMENTATION RATE: SED RATE: 2 mm/h (ref 0–20)

## 2018-04-13 LAB — CBC WITH DIFFERENTIAL/PLATELET
ABSOLUTE MONOCYTES: 506 {cells}/uL (ref 200–950)
BASOS PCT: 0.7 %
Basophils Absolute: 39 cells/uL (ref 0–200)
Eosinophils Absolute: 11 cells/uL — ABNORMAL LOW (ref 15–500)
Eosinophils Relative: 0.2 %
HEMATOCRIT: 49.2 % (ref 38.5–50.0)
HEMOGLOBIN: 16.2 g/dL (ref 13.2–17.1)
LYMPHS ABS: 1645 {cells}/uL (ref 850–3900)
MCH: 27.7 pg (ref 27.0–33.0)
MCHC: 32.9 g/dL (ref 32.0–36.0)
MCV: 84.1 fL (ref 80.0–100.0)
MPV: 11.2 fL (ref 7.5–12.5)
Monocytes Relative: 9.2 %
NEUTROS ABS: 3300 {cells}/uL (ref 1500–7800)
Neutrophils Relative %: 60 %
PLATELETS: 270 10*3/uL (ref 140–400)
RBC: 5.85 10*6/uL — AB (ref 4.20–5.80)
RDW: 12.4 % (ref 11.0–15.0)
TOTAL LYMPHOCYTE: 29.9 %
WBC: 5.5 10*3/uL (ref 3.8–10.8)

## 2018-04-13 LAB — COMPLETE METABOLIC PANEL WITH GFR
AG RATIO: 2.4 (calc) (ref 1.0–2.5)
ALBUMIN MSPROF: 4.5 g/dL (ref 3.6–5.1)
ALT: 17 U/L (ref 9–46)
AST: 18 U/L (ref 10–35)
Alkaline phosphatase (APISO): 174 U/L — ABNORMAL HIGH (ref 40–115)
BUN: 9 mg/dL (ref 7–25)
CALCIUM: 9.4 mg/dL (ref 8.6–10.3)
CO2: 28 mmol/L (ref 20–32)
CREATININE: 0.93 mg/dL (ref 0.70–1.25)
Chloride: 106 mmol/L (ref 98–110)
GFR, Est African American: 97 mL/min/{1.73_m2} (ref >=60)
GFR, Est Non African American: 84 mL/min/{1.73_m2} (ref >=60)
GLOBULIN: 1.9 g/dL (ref 1.9–3.7)
Glucose, Bld: 95 mg/dL (ref 65–99)
Potassium: 4.6 mmol/L (ref 3.5–5.3)
Sodium: 141 mmol/L (ref 135–146)
Total Bilirubin: 1.5 mg/dL — ABNORMAL HIGH (ref 0.2–1.2)
Total Protein: 6.4 g/dL (ref 6.1–8.1)

## 2018-04-13 LAB — PROTEIN ELECTROPHORESIS, SERUM, WITH REFLEX
ALPHA 2: 0.7 g/dL (ref 0.5–0.9)
Albumin ELP: 4.2 g/dL (ref 3.8–4.8)
Alpha 1: 0.3 g/dL (ref 0.2–0.3)
Beta 2: 0.3 g/dL (ref 0.2–0.5)
Beta Globulin: 0.4 g/dL (ref 0.4–0.6)
GAMMA GLOBULIN: 0.8 g/dL (ref 0.8–1.7)
Total Protein: 6.6 g/dL (ref 6.1–8.1)

## 2018-04-13 LAB — VITAMIN D 25 HYDROXY (VIT D DEFICIENCY, FRACTURES): VIT D 25 HYDROXY: 6 ng/mL — AB (ref 30–100)

## 2018-04-13 LAB — PHOSPHORUS: PHOSPHORUS: 2.8 mg/dL (ref 2.1–4.3)

## 2018-04-13 LAB — PARATHYROID HORMONE, INTACT (NO CA): PTH: 56 pg/mL (ref 14–64)

## 2018-04-13 LAB — IFE INTERPRETATION: Immunofix Electr Int: DETECTED

## 2018-04-16 ENCOUNTER — Ambulatory Visit (HOSPITAL_COMMUNITY): Payer: PPO

## 2018-04-16 ENCOUNTER — Other Ambulatory Visit (HOSPITAL_COMMUNITY): Payer: PPO

## 2018-04-19 ENCOUNTER — Ambulatory Visit (HOSPITAL_COMMUNITY)
Admission: RE | Admit: 2018-04-19 | Discharge: 2018-04-19 | Disposition: A | Discharge status: Home / Self Care | Payer: PPO | Source: Ambulatory Visit | Attending: Rheumatology | Admitting: Rheumatology

## 2018-04-19 DIAGNOSIS — R899 Unspecified abnormal finding in specimens from other organs, systems and tissues: Secondary | ICD-10-CM | POA: Diagnosis not present

## 2018-04-19 DIAGNOSIS — R748 Abnormal levels of other serum enzymes: Secondary | ICD-10-CM | POA: Insufficient documentation

## 2018-04-19 MED ORDER — TECHNETIUM TC 99M MEDRONATE IV KIT
21.5000 | PACK | Freq: Once | INTRAVENOUS | Status: AC | PRN
  Administered 2018-04-19: 21.5 via INTRAVENOUS

## 2018-04-20 ENCOUNTER — Other Ambulatory Visit (INDEPENDENT_AMBULATORY_CARE_PROVIDER_SITE_OTHER): Payer: Self-pay | Admitting: *Deleted

## 2018-04-20 DIAGNOSIS — D472 Monoclonal gammopathy: Secondary | ICD-10-CM | POA: Insufficient documentation

## 2018-04-20 DIAGNOSIS — M889 Osteitis deformans of unspecified bone: Secondary | ICD-10-CM | POA: Insufficient documentation

## 2018-04-20 DIAGNOSIS — R899 Unspecified abnormal finding in specimens from other organs, systems and tissues: Secondary | ICD-10-CM

## 2018-04-20 DIAGNOSIS — E559 Vitamin D deficiency, unspecified: Secondary | ICD-10-CM | POA: Insufficient documentation

## 2018-04-20 NOTE — Progress Notes (Signed)
These notify patient that the Paget's disease is localized to L1 vertebrae only.

## 2018-04-20 NOTE — Progress Notes (Deleted)
Office Visit Note  Patient: Lance King             Date of Birth: [birth year]           MRN: [id number]             PCP: Maryella Shivers, MD Referring: No ref. provider found Visit Date: 05/04/2018 Occupation: _0 @  Subjective:  No chief complaint on file.   History of Present Illness: Lance King is a 68 y.o. male ***   Activities of Daily Living:  Patient reports morning stiffness for *** {minute/hour:19697}.   Patient {ACTIONS;DENIES/REPORTS:21021675::"Denies"} nocturnal pain.  Difficulty dressing/grooming: {ACTIONS;DENIES/REPORTS:21021675::"Denies"} Difficulty climbing stairs: {ACTIONS;DENIES/REPORTS:21021675::"Denies"} Difficulty getting out of chair: {ACTIONS;DENIES/REPORTS:21021675::"Denies"} Difficulty using hands for taps, buttons, cutlery, and/or writing: {ACTIONS;DENIES/REPORTS:21021675::"Denies"}  No Rheumatology ROS completed.   PMFS History:  Patient Active Problem List   Diagnosis Date Noted  . DDD (degenerative disc disease), lumbar 04/06/2018  . Primary osteoarthritis of right knee 04/06/2018  . Adhesive capsulitis of right shoulder 04/06/2018  . Essential hypertension 04/06/2018    Past Medical History:  Diagnosis Date  . Complication of anesthesia    states a smaller tube has been used in the past due to sore throat  . Hypertension    EKG PCP 02/05/11 on chart    Family History  Problem Relation Age of Onset  . Hypertension Mother   . Hypertension Father    Past Surgical History:  Procedure Laterality Date  . BACK SURGERY     Lumbar decompression 1994  . KNEE ARTHROSCOPY     1987  . LUMBAR LAMINECTOMY/DECOMPRESSION MICRODISCECTOMY  03/05/2011   Procedure: LUMBAR LAMINECTOMY/DECOMPRESSION MICRODISCECTOMY;  Surgeon: Johnn Hai;  Location: [facility];  Service: Orthopedics;  Laterality: N/A;  Re do Decompression L3-L5  (X-Ray)    Central   Social History   Social History Narrative  . Not on file   There is no immunization  history for the selected administration types on file for this patient.   Objective: Vital Signs: There were no vitals taken for this visit.   Physical Exam   Musculoskeletal Exam: ***  CDAI Exam: CDAI Score: Not documented Patient Global Assessment: Not documented; Provider Global Assessment: Not documented Swollen: Not documented; Tender: Not documented Joint Exam   Not documented   There is currently no information documented on the homunculus. Go to the Rheumatology activity and complete the homunculus joint exam.  Investigation: No additional findings.  Imaging: Nm Bone Scan Whole Body  Result Date: 04/19/2018 CLINICAL DATA:  Elevated alkaline phosphatase, history of Paget's disease EXAM: NUCLEAR MEDICINE WHOLE BODY BONE SCAN TECHNIQUE: Whole body anterior and posterior images were obtained approximately 3 hours after intravenous injection of radiopharmaceutical. RADIOPHARMACEUTICALS:  21.5 mCi Technetium-58mMDP IV COMPARISON:  None Radiographic correlation: None recent; MRI lumbar spine 02/11/2010 FINDINGS: Uptake at the shoulders, sternoclavicular joints, hips, RIGHT foot, typically degenerative. Intense increased tracer activity at the thoracolumbar junction under approximately L1, throughout the entire vertebral body, could be seen with either metastatic disease or Paget's disease; the appearance by the prior MR exam was most consistent with Paget's disease. No additional sites of abnormal osseous tracer accumulation are identified. Scattered costochondral uptake of tracer, physiologic. Expected urinary tract and soft tissue distribution of tracer. IMPRESSION: Intense increased tracer accumulation throughout the L1 vertebral body consistent with appearance of Paget's disease as described on remote lumbar spine MR exam. Additional scattered degenerative type uptake without additional worrisome scintigraphic abnormalities. Electronically Signed   By: MLavonia Dana  M.D.   On: 04/19/2018  16:37    Recent Labs: Lab Results  Component Value Date   WBC 5.5 04/06/2018   HGB 16.2 04/06/2018   PLT 270 04/06/2018   NA 141 04/06/2018   K 4.6 04/06/2018   CL 106 04/06/2018   CO2 28 04/06/2018   GLUCOSE 95 04/06/2018   BUN 9 04/06/2018   CREATININE 0.93 04/06/2018   BILITOT 1.5 (H) 04/06/2018   ALKPHOS 167 (H) 03/03/2011   AST 18 04/06/2018   ALT 17 04/06/2018   PROT 6.4 04/06/2018   PROT 6.6 04/06/2018   ALBUMIN 3.9 03/03/2011   CALCIUM 9.4 04/06/2018   GFRAA 97 04/06/2018   April 06, 2018 ESR 2, PTH 56, vitamin D 6, phosphorus 2.8, IFE IgG lambda monoclonal protein present  Speciality Comments: No specialty comments available.  Procedures:  No procedures performed Allergies: Patient has no known allergies.   Assessment / Plan:     Visit Diagnoses: No diagnosis found.   Orders: No orders of the defined types were placed in this encounter.  No orders of the defined types were placed in this encounter.   Face-to-face time spent with patient was *** minutes. Greater than 50% of time was spent in counseling and coordination of care.  Follow-Up Instructions: No follow-ups on file.   Bo Merino, MD  Note - This record has been created using Editor, commissioning.  Chart creation errors have been sought, but may not always  have been located. Such creation errors do not reflect on  the standard of medical care.

## 2018-05-01 DIAGNOSIS — J00 Acute nasopharyngitis [common cold]: Secondary | ICD-10-CM | POA: Diagnosis not present

## 2018-05-04 ENCOUNTER — Ambulatory Visit: Payer: Self-pay | Admitting: Rheumatology

## 2018-05-05 NOTE — Progress Notes (Signed)
Office Visit Note  Patient: Lance King             Date of Birth: 09-16-1950           MRN: [id number]             PCP: Maryella Shivers, MD Referring: Maryella Shivers, MD Visit Date: 05/11/2018 Occupation: _0 @  Subjective:  Discuss Reclast   History of Present Illness: Lance King is a 68 y.o. male with history of Paget's disease of bone, osteoarthritis, and DDD.  Patient reports that he reviewed the information regarding Reclast infusions.  He states he is ready to proceed with applying through his insurance and scheduling the infusion.  He takes a vitamin D 50,000 units by mouth twice weekly and consumes calcium through his diet.  He reports that he has an appointment with hematology to discuss the SPEP results on Tuesday. He denies any joint pain or joint swelling at this time.  He denies any morning stiffness.  He denies any lower back pain.  He denies any right knee pain or joint swelling at this time. He has no questions or concerns at this time.   Activities of Daily Living:  Patient reports morning stiffness for 0.   Patient Denies nocturnal pain.  Difficulty dressing/grooming: Denies Difficulty climbing stairs: Denies Difficulty getting out of chair: Denies Difficulty using hands for taps, buttons, cutlery, and/or writing: Denies  Review of Systems  Constitutional: Negative for fatigue and night sweats.  HENT: Negative for mouth sores, trouble swallowing, trouble swallowing, mouth dryness and nose dryness.   Eyes: Negative for redness, visual disturbance and dryness.  Respiratory: Negative for cough, hemoptysis, shortness of breath and difficulty breathing.   Cardiovascular: Negative for chest pain, palpitations, hypertension, irregular heartbeat and swelling in legs/feet.  Gastrointestinal: Negative for blood in stool, constipation and diarrhea.  Endocrine: Negative for excessive thirst and increased urination.  Genitourinary: Negative for difficulty  urinating and painful urination.  Musculoskeletal: Positive for morning stiffness. Negative for arthralgias, joint pain, joint swelling, myalgias, muscle weakness, muscle tenderness and myalgias.  Skin: Negative for color change, rash, hair loss, nodules/bumps, skin tightness, ulcers and sensitivity to sunlight.  Allergic/Immunologic: Negative for susceptible to infections.  Neurological: Negative for dizziness, fainting, memory loss, night sweats and weakness.  Hematological: Negative for bruising/bleeding tendency and swollen glands.  Psychiatric/Behavioral: Negative for depressed mood and sleep disturbance. The patient is not nervous/anxious.     PMFS History:  Patient Active Problem List   Diagnosis Date Noted  . Paget's disease of bone 04/20/2018  . Abnormal SPEP 04/20/2018  . Vitamin D deficiency 04/20/2018  . DDD (degenerative disc disease), lumbar 04/06/2018  . Primary osteoarthritis of right knee 04/06/2018  . Adhesive capsulitis of right shoulder 04/06/2018  . Essential hypertension 04/06/2018    Past Medical History:  Diagnosis Date  . Complication of anesthesia    states a smaller tube has been used in the past due to sore throat  . Hypertension    EKG PCP 02/05/11 on chart    Family History  Problem Relation Age of Onset  . Hypertension Mother   . Hypertension Father    Past Surgical History:  Procedure Laterality Date  . BACK SURGERY     Lumbar decompression 1994  . KNEE ARTHROSCOPY     1987  . LUMBAR LAMINECTOMY/DECOMPRESSION MICRODISCECTOMY  03/05/2011   Procedure: LUMBAR LAMINECTOMY/DECOMPRESSION MICRODISCECTOMY;  Surgeon: Johnn Hai;  Location: [facility];  Service: Orthopedics;  Laterality: N/A;  Re  do Decompression L3-L5  (X-Ray)    [facility]   Social History   Social History Narrative  . Not on file   There is no immunization history for the selected administration types on file for this patient.   Objective: Vital Signs: BP (!) 146/86 (BP Location:  Left Arm, Patient Position: Sitting, Cuff Size: Normal)   Pulse 70   Resp 16   Ht _0  (1.727 m)   Wt 191 lb (86.6 kg)   BMI 29.04 kg/m    Physical Exam Vitals signs and nursing note reviewed.  Constitutional:      Appearance: He is well-developed.  HENT:     Head: Normocephalic and atraumatic.  Eyes:     Conjunctiva/sclera: Conjunctivae normal.     Pupils: Pupils are equal, round, and reactive to light.  Neck:     Musculoskeletal: Normal range of motion and neck supple.  Cardiovascular:     Rate and Rhythm: Normal rate and regular rhythm.     Heart sounds: Normal heart sounds.  Pulmonary:     Effort: Pulmonary effort is normal.     Breath sounds: Normal breath sounds.  Abdominal:     General: Bowel sounds are normal.     Palpations: Abdomen is soft.  Lymphadenopathy:     Cervical: No cervical adenopathy.  Skin:    General: Skin is warm and dry.     Capillary Refill: Capillary refill takes less than 2 seconds.  Neurological:     Mental Status: He is alert and oriented to person, place, and time.  Psychiatric:        Behavior: Behavior normal.      Musculoskeletal Exam: C-spine slightly limited range of motion with lateral rotation.  Limited range of motion of thoracic and lumbar spine.  No midline spinal tenderness.  Shoulder joints, elbow joints, wrist joints, MCPs, PIPs, DIPs good range of motion no synovitis.  Hip joints, knee joints, ankle joints, MTPs, PIPs, DIPs good range of motion no synovitis.  No warmth or effusion bilateral knee joints.  No tenderness or swelling of ankle joints.  No tenderness over trochanteric bursa bilaterally.   CDAI Exam: CDAI Score: Not documented Patient Global Assessment: Not documented; Provider Global Assessment: Not documented Swollen: Not documented; Tender: Not documented Joint Exam   Not documented   There is currently no information documented on the homunculus. Go to the Rheumatology activity and complete the homunculus  joint exam.  Investigation: No additional findings.  Imaging: Nm Bone Scan Whole Body  Result Date: 04/19/2018 CLINICAL DATA:  Elevated alkaline phosphatase, history of Paget's disease EXAM: NUCLEAR MEDICINE WHOLE BODY BONE SCAN TECHNIQUE: Whole body anterior and posterior images were obtained approximately 3 hours after intravenous injection of radiopharmaceutical. RADIOPHARMACEUTICALS:  21.5 mCi Technetium-33mMDP IV COMPARISON:  None Radiographic correlation: None recent; MRI lumbar spine 02/11/2010 FINDINGS: Uptake at the shoulders, sternoclavicular joints, hips, RIGHT foot, typically degenerative. Intense increased tracer activity at the thoracolumbar junction under approximately L1, throughout the entire vertebral body, could be seen with either metastatic disease or Paget's disease; the appearance by the prior MR exam was most consistent with Paget's disease. No additional sites of abnormal osseous tracer accumulation are identified. Scattered costochondral uptake of tracer, physiologic. Expected urinary tract and soft tissue distribution of tracer. IMPRESSION: Intense increased tracer accumulation throughout the L1 vertebral body consistent with appearance of Paget's disease as described on remote lumbar spine MR exam. Additional scattered degenerative type uptake without additional worrisome scintigraphic abnormalities. Electronically Signed  By: Lavonia Dana M.D.   On: 04/19/2018 16:37    Recent Labs: Lab Results  Component Value Date   WBC 5.5 04/06/2018   HGB 16.2 04/06/2018   PLT 270 04/06/2018   NA 141 04/06/2018   K 4.6 04/06/2018   CL 106 04/06/2018   CO2 28 04/06/2018   GLUCOSE 95 04/06/2018   BUN 9 04/06/2018   CREATININE 0.93 04/06/2018   BILITOT 1.5 (H) 04/06/2018   ALKPHOS 167 (H) 03/03/2011   AST 18 04/06/2018   ALT 17 04/06/2018   PROT 6.4 04/06/2018   PROT 6.6 04/06/2018   ALBUMIN 3.9 03/03/2011   CALCIUM 9.4 04/06/2018   GFRAA 97 04/06/2018  April 06, 2018  IFE showed IgG lambda monoclonal protein, ESR 2, PTH normal, vitamin D 6, phosphorus 2.8 normal  Speciality Comments: No specialty comments available.  Procedures:  No procedures performed Allergies: Patient has no known allergies.   Assessment / Plan:     Visit Diagnoses: Paget's disease of bone -He is asymptomatic at this time. MRI of lumbar spine on 11/17/17 revealed findings suspicious for Paget's at L1 and the total body bone scan confirmed the diagnosis of Paget's diease localized to L1 on 04/19/18. At his initial visit, Reclast was discussed. He is currently taking Vitamin D 50,000 units by mouth twice weekly and consuming calcium in his diet. He reviewed the handout of drug information about Reclast.  He has not questions or concerns at this time. He is ready to proceed with scheduling the Reclast infusion.  Orders for CBC and CMP were placed today, which are required 30 days prior to the first infusion. Plan: CBC with Differential/Platelet, COMPLETE METABOLIC PANEL WITH GFR  Vitamin D deficiency: He is currently taking vitamin D 50,000 units by mouth twice weekly for 3 months.  We will recheck vitamin D after he completes the course.   Abnormal SPEP - He was referred to hematology and he will be evaluated on 05/18/18.   Primary osteoarthritis of right knee: No warmth or effusion.  He has good ROM with no discomfort.    DDD (degenerative disc disease), lumbar: He has limited ROM due to postsurgical changes.  He has no midline spinal tenderness or discomfort at this time.   Essential hypertension: BP was 146/86 today in the office.  Been advised to monitor blood pressure and follow-up with the PCP.  Orders: Orders Placed This Encounter  Procedures  . CBC with Differential/Platelet  . COMPLETE METABOLIC PANEL WITH GFR   No orders of the defined types were placed in this encounter.     Follow-Up Instructions: Return for Paget's disease.   Ofilia Neas, PA-C   I examined and  evaluated the patient with Hazel Sams PA.  Patient is currently not having symptoms of lower back pain.  He does have some disc disease in his lower back which causes discomfort off and on.  He has osteoarthritis in his knee joint which causes discomfort.  He was placed on vitamin D due to vitamin D deficiency.  We will check vitamin D level today.  Once his vitamin D is about 25 he can get his Reclast infusion.  He is supposed to get Reclast infusion after labs are obtained.  Also has appointment coming up with hematology for abnormal IFE.  The plan of care was discussed as noted above.  Bo Merino, MD  Note - This record has been created using Editor, commissioning.  Chart creation errors have been sought, but may  not always  have been located. Such creation errors do not reflect on  the standard of medical care.

## 2018-05-10 ENCOUNTER — Telehealth: Payer: Self-pay | Admitting: Pharmacist

## 2018-05-10 NOTE — Telephone Encounter (Signed)
Please start benefits investigation for Reclast.  Thanks!

## 2018-05-11 ENCOUNTER — Telehealth: Payer: Self-pay | Admitting: Pharmacist

## 2018-05-11 ENCOUNTER — Ambulatory Visit: Payer: PPO | Admitting: Rheumatology

## 2018-05-11 ENCOUNTER — Encounter: Payer: Self-pay | Admitting: Rheumatology

## 2018-05-11 VITALS — BP 146/86 | HR 70 | Resp 16 | Ht 68.0 in | Wt 191.0 lb

## 2018-05-11 DIAGNOSIS — E559 Vitamin D deficiency, unspecified: Secondary | ICD-10-CM | POA: Diagnosis not present

## 2018-05-11 DIAGNOSIS — R778 Other specified abnormalities of plasma proteins: Secondary | ICD-10-CM

## 2018-05-11 DIAGNOSIS — M1711 Unilateral primary osteoarthritis, right knee: Secondary | ICD-10-CM

## 2018-05-11 DIAGNOSIS — I10 Essential (primary) hypertension: Secondary | ICD-10-CM

## 2018-05-11 DIAGNOSIS — M5136 Other intervertebral disc degeneration, lumbar region: Secondary | ICD-10-CM

## 2018-05-11 DIAGNOSIS — M889 Osteitis deformans of unspecified bone: Secondary | ICD-10-CM

## 2018-05-11 NOTE — Patient Instructions (Signed)
Your orders have been placed for your Reclast infusion.  Please call office one month prior to next infusion to renew insurance approval and to schedule labs.  Once you have had lab work an order will be placed and you may call to schedule an appointment at one of the following:  [facility] [place] [phone]

## 2018-05-11 NOTE — Telephone Encounter (Signed)
After further review orders pending Vitamin D level

## 2018-05-11 NOTE — Telephone Encounter (Signed)
Patient has an active [employer] Medicare PPO Plan. They follow Medicare guidelines. No Pre-cert is required for Reclast J-code [id number] in a Hospital Outpatient setting. Patient has a 20% coinsurance.   TGR#[id number] [place] Phone# [phone]

## 2018-05-11 NOTE — Telephone Encounter (Signed)
Please place orders for Reclast per Dr. Estanislado Pandy.  He has been given the information for scheduling an appointment.

## 2018-05-12 ENCOUNTER — Telehealth: Payer: Self-pay | Admitting: *Deleted

## 2018-05-12 ENCOUNTER — Other Ambulatory Visit: Payer: Self-pay | Admitting: *Deleted

## 2018-05-12 DIAGNOSIS — M889 Osteitis deformans of unspecified bone: Secondary | ICD-10-CM

## 2018-05-12 LAB — COMPLETE METABOLIC PANEL WITH GFR
AG Ratio: 1.7 (calc) (ref 1.0–2.5)
ALT: 18 U/L (ref 9–46)
AST: 15 U/L (ref 10–35)
Albumin: 4 g/dL (ref 3.6–5.1)
Alkaline phosphatase (APISO): 166 U/L — ABNORMAL HIGH (ref 35–144)
BUN: 9 mg/dL (ref 7–25)
CALCIUM: 9.3 mg/dL (ref 8.6–10.3)
CO2: 29 mmol/L (ref 20–32)
CREATININE: 0.99 mg/dL (ref 0.70–1.25)
Chloride: 105 mmol/L (ref 98–110)
GFR, EST NON AFRICAN AMERICAN: 78 mL/min/{1.73_m2} (ref >=60)
GFR, Est African American: 90 mL/min/{1.73_m2} (ref >=60)
GLOBULIN: 2.3 g/dL (ref 1.9–3.7)
GLUCOSE: 102 mg/dL — AB (ref 65–99)
Potassium: 4.6 mmol/L (ref 3.5–5.3)
SODIUM: 140 mmol/L (ref 135–146)
Total Bilirubin: 0.8 mg/dL (ref 0.2–1.2)
Total Protein: 6.3 g/dL (ref 6.1–8.1)

## 2018-05-12 LAB — CBC WITH DIFFERENTIAL/PLATELET
Absolute Monocytes: 546 cells/uL (ref 200–950)
BASOS ABS: 42 {cells}/uL (ref 0–200)
Basophils Relative: 0.7 %
EOS ABS: 18 {cells}/uL (ref 15–500)
Eosinophils Relative: 0.3 %
HEMATOCRIT: 48.4 % (ref 38.5–50.0)
HEMOGLOBIN: 15.5 g/dL (ref 13.2–17.1)
LYMPHS ABS: 1710 {cells}/uL (ref 850–3900)
MCH: 26.7 pg — AB (ref 27.0–33.0)
MCHC: 32 g/dL (ref 32.0–36.0)
MCV: 83.3 fL (ref 80.0–100.0)
MPV: 10.4 fL (ref 7.5–12.5)
Monocytes Relative: 9.1 %
NEUTROS ABS: 3684 {cells}/uL (ref 1500–7800)
Neutrophils Relative %: 61.4 %
Platelets: 334 10*3/uL (ref 140–400)
RBC: 5.81 10*6/uL — ABNORMAL HIGH (ref 4.20–5.80)
RDW: 12.7 % (ref 11.0–15.0)
Total Lymphocyte: 28.5 %
WBC: 6 10*3/uL (ref 3.8–10.8)

## 2018-05-12 LAB — VITAMIN D 25 HYDROXY (VIT D DEFICIENCY, FRACTURES): Vit D, 25-Hydroxy: 34 ng/mL (ref 30–100)

## 2018-05-12 MED ORDER — VITAMIN D (ERGOCALCIFEROL) 1.25 MG (50000 UNIT) PO CAPS: 50000.0000 [IU] | ORAL_CAPSULE | ORAL | 1 refills | Status: DC

## 2018-05-12 NOTE — Progress Notes (Signed)
Vitamin D is normal now.  Please call in vitamin D 50,000 units twice a month for 6 months.  We will recheck vitamin D level in 6 months.

## 2018-05-12 NOTE — Telephone Encounter (Signed)
Patient advised of lab results and Reclast orders have been placed.

## 2018-05-12 NOTE — Telephone Encounter (Signed)
-----   Message from Bo Merino, MD sent at 05/12/2018 12:24 PM EST ----- Vitamin D is normal now.  Please call in vitamin D 50,000 units twice a month for 6 months.  We will recheck vitamin D level in 6 months.

## 2018-05-14 NOTE — Progress Notes (Addendum)
[place] CONSULT NOTE  Patient Care Team: Maryella Shivers, MD as PCP - General (Family Medicine)  HEME/ONC OVERVIEW: 1. Abnormal SPEP -03/2018: SPEP by rheumatology shwoed a faint restricted band in the gamma globulin region; no quantifiable M-spike or IFE done   ASSESSMENT & PLAN:   Abnormal SPEP -I reviewed the patient's records in detail, including rheumatology clinic notes, lab studies, and imaging results -I also independently reviewed the radiologic images of recent NM bone scan, and agree with findings as documented -In summary, patient had MRI of the lumbar spine in 10/2017, which showed expansion of L1 vertebral body and the posterior processes, including mixed areas of sclerosis in the fat marrow signal, compatible with Paget's disease.  He was referred to rheumatology for further management. SPEP was obtained, which showed a faint restricted band in the gammaglobulin region without quantifiable M-spike. No IFE was done.  NM bone scan showed intense increased tracer accumulation throughout the L1 vertebral body, consistent with Paget's disease. -To further define the abnormal SPEP, I have ordered SPEP, IFE, free light chains, 24hr urine study with UPEP and UFE, LDH and B2M  -PET scan to be determined, pending studies above  -Patient reports that he had a bone biopsy of L1 spine lesion in the 1990's, and reportedly showed benign blood vessel formation  -Consent for release of information signed today, and we will attempt to obtain the records from [facility]   Paget's disease of the bone -On Reclast infusion with rheumatology -Patient reports that his back back is relatively stable -I encourage the patient to follow up with rheumatology for further management   All questions were answered. The patient knows to call the clinic with any problems, questions or concerns.  Return in 2 weeks to follow up recent MM lab studies.   [name],  MD 05/18/2018 1:22 PM   CHIEF COMPLAINTS/PURPOSE OF CONSULTATION:  "I am doing well"  HISTORY OF PRESENTING ILLNESS:  Lance King 68 y.o. male is here because of abnormal SPEP. Patient reports that he first developed new onset low back pain radiating to the right leg in 10/2017.  He has a history of chronic degenerative disc disease, and thought that it was due to the arthritis in the back, so he presented to orthopedics for further evaluation.  MRI of the lumbar spine showed findings in the L1 vertebral body consistent with Paget's disease, and he was referred to rheumatology for further evaluation.  Nuclear medicine bone scan confirmed abnormality in the L1 region consistent with the known Paget's disease without other bony abnormalities.  However, SPEP was checked, which showed  faint restricted band in the gammaglobulin region without quantifiable M-spike, for which patient is referred to hematology for further evaluation.  Patient reports that his back pain is overall stable today.  He denies any fever, chill, night sweats, weight change, chest pain, dyspnea, abdominal pain, nausea, vomiting, diarrhea, abnormal bleeding/bruising, or numbness/tingling/weakness in the extremities.  Of note, patient reports that he was found with abnormal bony lesion of the L1 vertebral body in the 1990s while he was undergoing back surgery.  He underwent biopsy of the bony lesion at [facility] in [place], [place], and reportedly the biopsy showed benign blood vessel formation.  He does not have the records today.  MEDICAL HISTORY:  Past Medical History:  Diagnosis Date  . Complication of anesthesia    states a smaller tube has been used in the past due  to sore throat  . Hypertension    EKG PCP 02/05/11 on chart    SURGICAL HISTORY: Past Surgical History:  Procedure Laterality Date  . BACK SURGERY     Lumbar decompression 1994  . KNEE ARTHROSCOPY     1987  . LUMBAR  LAMINECTOMY/DECOMPRESSION MICRODISCECTOMY  03/05/2011   Procedure: LUMBAR LAMINECTOMY/DECOMPRESSION MICRODISCECTOMY;  Surgeon: Johnn Hai;  Location: [facility];  Service: Orthopedics;  Laterality: N/A;  Re do Decompression L3-L5  (X-Ray)    [facility]    SOCIAL HISTORY: Social History   Socioeconomic History  . Marital status: Married    Spouse name: Not on file  . Number of children: Not on file  . Years of education: Not on file  . Highest education level: Not on file  Occupational History  . Not on file  Social Needs  . Financial resource strain: Not on file  . Food insecurity:    Worry: Not on file    Inability: Not on file  . Transportation needs:    Medical: Not on file    Non-medical: Not on file  Tobacco Use  . Smoking status: Never Smoker  . Smokeless tobacco: Never Used  Substance and Sexual Activity  . Alcohol use: Yes    Alcohol/week: 2.0 standard drinks    Types: 2 Cans of beer per week  . Drug use: No  . Sexual activity: Not on file  Lifestyle  . Physical activity:    Days per week: Not on file    Minutes per session: Not on file  . Stress: Not on file  Relationships  . Social connections:    Talks on phone: Not on file    Gets together: Not on file    Attends religious service: Not on file    Active member of club or organization: Not on file    Attends meetings of clubs or organizations: Not on file    Relationship status: Not on file  . Intimate partner violence:    Fear of current or ex partner: Not on file    Emotionally abused: Not on file    Physically abused: Not on file    Forced sexual activity: Not on file  Other Topics Concern  . Not on file  Social History Narrative  . Not on file    FAMILY HISTORY: Family History  Problem Relation Age of Onset  . Hypertension Mother   . Hypertension Father     ALLERGIES:  has No Known Allergies.  MEDICATIONS:  Current Outpatient Medications  Medication Sig Dispense Refill  . finasteride  (PROSCAR) 5 MG tablet finasteride 5 mg tablet    . lisinopril (PRINIVIL,ZESTRIL) 20 MG tablet lisinopril 20 mg tablet    . tadalafil (CIALIS) 5 MG tablet tadalafil 5 mg tablet  TAKE 1 TABLET BY MOUTH DAILY.    Marland Kitchen Vitamin D, Ergocalciferol, (DRISDOL) 1.25 MG (50000 UT) CAPS capsule Take 1 capsule (50,000 Units total) by mouth every 14 (fourteen) days. 6 capsule 1   No current facility-administered medications for this visit.     REVIEW OF SYSTEMS:   Constitutional: ( - ) fevers, ( - )  chills , ( - ) night sweats Eyes: ( - ) blurriness of vision, ( - ) double vision, ( - ) watery eyes Ears, nose, mouth, throat, and face: ( - ) mucositis, ( - ) sore throat Respiratory: ( - ) cough, ( - ) dyspnea, ( - ) wheezes Cardiovascular: ( - ) palpitation, ( - )  chest discomfort, ( - ) lower extremity swelling Gastrointestinal:  ( - ) nausea, ( - ) heartburn, ( - ) change in bowel habits Skin: ( - ) abnormal skin rashes Lymphatics: ( - ) new lymphadenopathy, ( - ) easy bruising Neurological: ( - ) numbness, ( - ) tingling, ( - ) new weaknesses Behavioral/Psych: ( - ) mood change, ( - ) new changes  All other systems were reviewed with the patient and are negative.  PHYSICAL EXAMINATION: ECOG PERFORMANCE STATUS: 0 - Asymptomatic  Vitals:   05/18/18 1248  BP: (!) 172/74  Pulse: (!) 58  Resp: 20  Temp: 98 F (36.7 C)   Filed Weights   05/18/18 1248  Weight: 191 lb 8 oz (86.9 kg)    GENERAL: alert, no distress and comfortable, well appearing  SKIN: skin color, texture, turgor are normal, no rashes or significant lesions EYES: conjunctiva are pink and non-injected, sclera clear OROPHARYNX: no exudate, no erythema; lips, buccal mucosa, and tongue normal  NECK: supple, non-tender LYMPH:  no palpable lymphadenopathy in the cervical LUNGS: clear to auscultation with normal breathing effort HEART: regular rate & rhythm, no murmurs, no lower extremity edema ABDOMEN: soft, non-tender,  non-distended, normal bowel sounds Musculoskeletal: no cyanosis of digits and no clubbing  PSYCH: alert & oriented x 3, fluent speech NEURO: no focal motor/sensory deficits  LABORATORY DATA:  I have reviewed the data as listed Lab Results  Component Value Date   WBC 6.2 05/18/2018   HGB 15.6 05/18/2018   HCT 47.9 05/18/2018   MCV 83.6 05/18/2018   PLT 323 05/18/2018   Lab Results  Component Value Date   NA 143 05/18/2018   K 4.2 05/18/2018   CL 106 05/18/2018   CO2 29 05/18/2018    RADIOGRAPHIC STUDIES: I have personally reviewed the radiological images as listed and agreed with the findings in the report. Nm Bone Scan Whole Body  Result Date: 04/19/2018 CLINICAL DATA:  Elevated alkaline phosphatase, history of Paget's disease EXAM: NUCLEAR MEDICINE WHOLE BODY BONE SCAN TECHNIQUE: Whole body anterior and posterior images were obtained approximately 3 hours after intravenous injection of radiopharmaceutical. RADIOPHARMACEUTICALS:  21.5 mCi Technetium-19m MDP IV COMPARISON:  None Radiographic correlation: None recent; MRI lumbar spine 02/11/2010 FINDINGS: Uptake at the shoulders, sternoclavicular joints, hips, RIGHT foot, typically degenerative. Intense increased tracer activity at the thoracolumbar junction under approximately L1, throughout the entire vertebral body, could be seen with either metastatic disease or Paget's disease; the appearance by the prior MR exam was most consistent with Paget's disease. No additional sites of abnormal osseous tracer accumulation are identified. Scattered costochondral uptake of tracer, physiologic. Expected urinary tract and soft tissue distribution of tracer. IMPRESSION: Intense increased tracer accumulation throughout the L1 vertebral body consistent with appearance of Paget's disease as described on remote lumbar spine MR exam. Additional scattered degenerative type uptake without additional worrisome scintigraphic abnormalities. Electronically  Signed   By: Lavonia Dana M.D.   On: 04/19/2018 16:37    PATHOLOGY: I have reviewed the pathology reports as documented in the oncologist history.

## 2018-05-17 ENCOUNTER — Telehealth: Payer: Self-pay | Admitting: Rheumatology

## 2018-05-17 ENCOUNTER — Other Ambulatory Visit (HOSPITAL_COMMUNITY): Payer: Self-pay | Admitting: General Practice

## 2018-05-17 NOTE — Telephone Encounter (Signed)
Patient called stating he was told he could not schedule his Reclast infusion until we fax a copy of his labwork results to their office.

## 2018-05-17 NOTE — Telephone Encounter (Addendum)
Patient is due to schedule next infusion at [facility]. Please send in orders. Patient states he spoke with the hospital, and do not have orders.

## 2018-05-17 NOTE — Telephone Encounter (Signed)
Patient advised orders for Reclast have been placed. Patient advised orders were placed on 05/12/18. Patient provided with number for [facility] infusion [facility].

## 2018-05-17 NOTE — Telephone Encounter (Signed)
Patient advised orders and labs are in the computer. Patient advised to contact the infusion center in the morning to schedule his infusion.

## 2018-05-18 ENCOUNTER — Inpatient Hospital Stay: Payer: PPO | Attending: Hematology | Admitting: Hematology

## 2018-05-18 ENCOUNTER — Encounter: Payer: Self-pay | Admitting: Hematology

## 2018-05-18 ENCOUNTER — Other Ambulatory Visit: Payer: Self-pay | Admitting: Hematology

## 2018-05-18 ENCOUNTER — Other Ambulatory Visit (HOSPITAL_COMMUNITY): Payer: Self-pay | Admitting: General Practice

## 2018-05-18 ENCOUNTER — Inpatient Hospital Stay: Payer: PPO

## 2018-05-18 ENCOUNTER — Other Ambulatory Visit: Payer: Self-pay

## 2018-05-18 VITALS — BP 172/74 | HR 58 | Temp 98.0°F | Resp 20 | Wt 191.5 lb

## 2018-05-18 DIAGNOSIS — R778 Other specified abnormalities of plasma proteins: Secondary | ICD-10-CM | POA: Diagnosis not present

## 2018-05-18 DIAGNOSIS — M889 Osteitis deformans of unspecified bone: Secondary | ICD-10-CM | POA: Diagnosis not present

## 2018-05-18 LAB — CMP (CANCER CENTER ONLY)
ALT: 15 U/L (ref 0–44)
ANION GAP: 8 (ref 5–15)
AST: 16 U/L (ref 15–41)
Albumin: 4.4 g/dL (ref 3.5–5.0)
Alkaline Phosphatase: 165 U/L — ABNORMAL HIGH (ref 38–126)
BUN: 11 mg/dL (ref 8–23)
CO2: 29 mmol/L (ref 22–32)
Calcium: 9.5 mg/dL (ref 8.9–10.3)
Chloride: 106 mmol/L (ref 98–111)
Creatinine: 0.99 mg/dL (ref 0.61–1.24)
GFR, Est AFR Am: >60 mL/min (ref >60)
GFR, Estimated: >60 mL/min (ref >60)
Glucose, Bld: 98 mg/dL (ref 70–99)
Potassium: 4.2 mmol/L (ref 3.5–5.1)
Sodium: 143 mmol/L (ref 135–145)
Total Bilirubin: 0.9 mg/dL (ref 0.3–1.2)
Total Protein: 6.5 g/dL (ref 6.5–8.1)

## 2018-05-18 LAB — CBC WITH DIFFERENTIAL (CANCER CENTER ONLY)
Abs Immature Granulocytes: 0.02 10*3/uL (ref 0.00–0.07)
Basophils Absolute: 0.1 10*3/uL (ref 0.0–0.1)
Basophils Relative: 1 %
Eosinophils Absolute: 0 10*3/uL (ref 0.0–0.5)
Eosinophils Relative: 0 %
HCT: 47.9 % (ref 39.0–52.0)
Hemoglobin: 15.6 g/dL (ref 13.0–17.0)
Immature Granulocytes: 0 %
Lymphocytes Relative: 31 %
Lymphs Abs: 1.9 10*3/uL (ref 0.7–4.0)
MCH: 27.2 pg (ref 26.0–34.0)
MCHC: 32.6 g/dL (ref 30.0–36.0)
MCV: 83.6 fL (ref 80.0–100.0)
Monocytes Absolute: 0.5 10*3/uL (ref 0.1–1.0)
Monocytes Relative: 9 %
Neutro Abs: 3.7 10*3/uL (ref 1.7–7.7)
Neutrophils Relative %: 59 %
Platelet Count: 323 10*3/uL (ref 150–400)
RBC: 5.73 MIL/uL (ref 4.22–5.81)
RDW: 13.1 % (ref 11.5–15.5)
WBC Count: 6.2 10*3/uL (ref 4.0–10.5)
nRBC: 0 % (ref 0.0–0.2)

## 2018-05-18 LAB — SAVE SMEAR(SSMR), FOR PROVIDER SLIDE REVIEW

## 2018-05-19 ENCOUNTER — Telehealth: Payer: Self-pay | Admitting: Hematology

## 2018-05-19 ENCOUNTER — Other Ambulatory Visit: Payer: Self-pay | Admitting: Hematology

## 2018-05-19 LAB — KAPPA/LAMBDA LIGHT CHAINS
Kappa free light chain: 9.8 mg/L (ref 3.3–19.4)
Kappa, lambda light chain ratio: 0.16 — ABNORMAL LOW (ref 0.26–1.65)
Lambda free light chains: 60.6 mg/L — ABNORMAL HIGH (ref 5.7–26.3)

## 2018-05-19 LAB — BETA 2 MICROGLOBULIN, SERUM: Beta-2 Microglobulin: 1.4 mg/L (ref 0.6–2.4)

## 2018-05-19 NOTE — Telephone Encounter (Signed)
Faxed medical records release to   [facility] [address], [place] [address]  Ph [phone] Fax [phone]  Copy Scanned

## 2018-05-20 ENCOUNTER — Telehealth: Payer: Self-pay | Admitting: *Deleted

## 2018-05-20 ENCOUNTER — Other Ambulatory Visit: Payer: Self-pay | Admitting: Hematology

## 2018-05-20 DIAGNOSIS — D472 Monoclonal gammopathy: Secondary | ICD-10-CM

## 2018-05-20 LAB — MULTIPLE MYELOMA PANEL, SERUM
Albumin SerPl Elph-Mcnc: 3.7 g/dL (ref 2.9–4.4)
Albumin/Glob SerPl: 1.5 (ref 0.7–1.7)
Alpha 1: 0.2 g/dL (ref 0.0–0.4)
Alpha2 Glob SerPl Elph-Mcnc: 0.8 g/dL (ref 0.4–1.0)
B-Globulin SerPl Elph-Mcnc: 0.9 g/dL (ref 0.7–1.3)
Gamma Glob SerPl Elph-Mcnc: 0.8 g/dL (ref 0.4–1.8)
Globulin, Total: 2.6 g/dL (ref 2.2–3.9)
IgA: 73 mg/dL (ref 61–437)
IgG (Immunoglobin G), Serum: 891 mg/dL (ref 700–1600)
IgM (Immunoglobulin M), Srm: 18 mg/dL — ABNORMAL LOW (ref 20–172)
M Protein SerPl Elph-Mcnc: 0.2 g/dL — ABNORMAL HIGH
Total Protein ELP: 6.3 g/dL (ref 6.0–8.5)

## 2018-05-20 NOTE — Telephone Encounter (Signed)
Patient notified per order of Dr. Maylon Peppers that a small amount of abnormal protein was found in his blood work and that Dr. Maylon Peppers has placed an order for a PET scan to further evaluate L1.  Pt appreciative of call and has no questions at this time.

## 2018-05-24 LAB — UPEP/UIFE/LIGHT CHAINS/TP, 24-HR UR
% BETA, Urine: 30.6 %
ALPHA 1 URINE: 4.1 %
Albumin, U: 40.8 %
Alpha 2, Urine: 12.6 %
FREE LAMBDA LT CHAINS, UR: 5.51 mg/L (ref 0.47–11.77)
Free Kappa Lt Chains,Ur: 28.61 mg/L (ref 0.63–113.79)
Free Kappa/Lambda Ratio: 5.19 (ref 1.03–31.76)
GAMMA GLOBULIN URINE: 11.9 %
Total Protein, Urine-Ur/day: 121 mg/24 hr (ref 30–150)
Total Protein, Urine: 11.5 mg/dL
Total Volume: 1050

## 2018-06-01 ENCOUNTER — Ambulatory Visit (HOSPITAL_COMMUNITY)
Admission: RE | Admit: 2018-06-01 | Discharge: 2018-06-01 | Disposition: A | Discharge status: Home / Self Care | Payer: PPO | Source: Ambulatory Visit | Attending: Family Medicine | Admitting: Family Medicine

## 2018-06-01 ENCOUNTER — Encounter (INDEPENDENT_AMBULATORY_CARE_PROVIDER_SITE_OTHER): Payer: Self-pay

## 2018-06-01 ENCOUNTER — Encounter (HOSPITAL_COMMUNITY): Payer: Self-pay

## 2018-06-01 ENCOUNTER — Other Ambulatory Visit: Payer: Self-pay

## 2018-06-01 DIAGNOSIS — M889 Osteitis deformans of unspecified bone: Secondary | ICD-10-CM | POA: Insufficient documentation

## 2018-06-01 MED ORDER — ZOLEDRONIC ACID 5 MG/100ML IV SOLN
5.0000 mg | Freq: Once | INTRAVENOUS | Status: AC
  Administered 2018-06-01: 5 mg via INTRAVENOUS
  Filled 2018-06-01: qty 100

## 2018-06-01 MED ORDER — DIPHENHYDRAMINE HCL 25 MG PO CAPS
25.0000 mg | ORAL_CAPSULE | Freq: Once | ORAL | Status: AC
  Administered 2018-06-01: 25 mg via ORAL
  Filled 2018-06-01: qty 1

## 2018-06-01 MED ORDER — SODIUM CHLORIDE 0.9 % IV SOLN
INTRAVENOUS | Status: DC
  Administered 2018-06-01: 13:00:00 via INTRAVENOUS

## 2018-06-01 MED ORDER — ACETAMINOPHEN 325 MG PO TABS
650.0000 mg | ORAL_TABLET | Freq: Once | ORAL | Status: AC
  Administered 2018-06-01: 650 mg via ORAL
  Filled 2018-06-01: qty 2

## 2018-06-01 NOTE — Discharge Instructions (Signed)
This is a YEARLY INFUSION.   MD will arrange next appointment and required LAB work.     Zoledronic Acid injection (Paget's Disease, Osteoporosis) What is this medicine? ZOLEDRONIC ACID (ZOE le dron ik AS id) lowers the amount of calcium loss from bone. It is used to treat Paget's disease and osteoporosis in women. This medicine may be used for other purposes; ask your health care provider or pharmacist if you have questions. COMMON BRAND NAME(S): Reclast, Zometa What should I tell my health care provider before I take this medicine? They need to know if you have any of these conditions: -aspirin-sensitive asthma -cancer, especially if you are receiving medicines used to treat cancer -dental disease or wear dentures -infection -kidney disease -low levels of calcium in the blood -past surgery on the parathyroid gland or intestines -receiving corticosteroids like dexamethasone or prednisone -an unusual or allergic reaction to zoledronic acid, other medicines, foods, dyes, or preservatives -pregnant or trying to get pregnant -breast-feeding How should I use this medicine? This medicine is for infusion into a vein. It is given by a health care professional in a hospital or clinic setting. Talk to your pediatrician regarding the use of this medicine in children. This medicine is not approved for use in children. Overdosage: If you think you have taken too much of this medicine contact a poison control center or emergency room at once. NOTE: This medicine is only for you. Do not share this medicine with others. What if I miss a dose? It is important not to miss your dose. Call your doctor or health care professional if you are unable to keep an appointment. What may interact with this medicine? -certain antibiotics given by injection -NSAIDs, medicines for pain and inflammation, like ibuprofen or naproxen -some diuretics like bumetanide, furosemide -teriparatide This list may not describe  all possible interactions. Give your health care provider a list of all the medicines, herbs, non-prescription drugs, or dietary supplements you use. Also tell them if you smoke, drink alcohol, or use illegal drugs. Some items may interact with your medicine. What should I watch for while using this medicine? Visit your doctor or health care professional for regular checkups. It may be some time before you see the benefit from this medicine. Do not stop taking your medicine unless your doctor tells you to. Your doctor may order blood tests or other tests to see how you are doing. Women should inform their doctor if they wish to become pregnant or think they might be pregnant. There is a potential for serious side effects to an unborn child. Talk to your health care professional or pharmacist for more information. You should make sure that you get enough calcium and vitamin D while you are taking this medicine. Discuss the foods you eat and the vitamins you take with your health care professional. Some people who take this medicine have severe bone, joint, and/or muscle pain. This medicine may also increase your risk for jaw problems or a broken thigh bone. Tell your doctor right away if you have severe pain in your jaw, bones, joints, or muscles. Tell your doctor if you have any pain that does not go away or that gets worse. Tell your dentist and dental surgeon that you are taking this medicine. You should not have major dental surgery while on this medicine. See your dentist to have a dental exam and fix any dental problems before starting this medicine. Take good care of your teeth while on this medicine.  Make sure you see your dentist for regular follow-up appointments. What side effects may I notice from receiving this medicine? Side effects that you should report to your doctor or health care professional as soon as possible: -allergic reactions like skin rash, itching or hives, swelling of the face,  lips, or tongue -anxiety, confusion, or depression -breathing problems -changes in vision -eye pain -feeling faint or lightheaded, falls -jaw pain, especially after dental work -mouth sores -muscle cramps, stiffness, or weakness -redness, blistering, peeling or loosening of the skin, including inside the mouth -trouble passing urine or change in the amount of urine Side effects that usually do not require medical attention (report to your doctor or health care professional if they continue or are bothersome): -bone, joint, or muscle pain -constipation -diarrhea -fever -hair loss -irritation at site where injected -loss of appetite -nausea, vomiting -stomach upset -trouble sleeping -trouble swallowing -weak or tired This list may not describe all possible side effects. Call your doctor for medical advice about side effects. You may report side effects to FDA at 1-800-FDA-1088. Where should I keep my medicine? This drug is given in a hospital or clinic and will not be stored at home. NOTE: This sheet is a summary. It may not cover all possible information. If you have questions about this medicine, talk to your doctor, pharmacist, or health care provider.  2019 Elsevier/Gold Standard (2013-08-13 14:19:57)

## 2018-06-01 NOTE — Progress Notes (Signed)
Here today for first Infusion of Reclast given- tolerated well.  Information sheet and instructions given and reviewed with pt. Pt. Discharged;  ambulatory to lobby.

## 2018-06-02 ENCOUNTER — Other Ambulatory Visit: Payer: Self-pay | Admitting: Family

## 2018-06-02 ENCOUNTER — Ambulatory Visit (HOSPITAL_COMMUNITY)
Admission: RE | Admit: 2018-06-02 | Discharge: 2018-06-02 | Disposition: A | Discharge status: Home / Self Care | Payer: PPO | Source: Ambulatory Visit | Attending: Hematology | Admitting: Hematology

## 2018-06-02 ENCOUNTER — Other Ambulatory Visit: Payer: Self-pay | Admitting: Hematology

## 2018-06-02 ENCOUNTER — Other Ambulatory Visit: Payer: Self-pay | Admitting: *Deleted

## 2018-06-02 DIAGNOSIS — D472 Monoclonal gammopathy: Secondary | ICD-10-CM | POA: Insufficient documentation

## 2018-06-02 DIAGNOSIS — Z79899 Other long term (current) drug therapy: Secondary | ICD-10-CM | POA: Insufficient documentation

## 2018-06-02 LAB — GLUCOSE, CAPILLARY: Glucose-Capillary: 82 mg/dL (ref 70–99)

## 2018-06-02 MED ORDER — FLUDEOXYGLUCOSE F - 18 (FDG) INJECTION
9.5500 | Freq: Once | INTRAVENOUS | Status: AC
  Administered 2018-06-02: 9.95 via INTRAVENOUS

## 2018-06-03 ENCOUNTER — Other Ambulatory Visit: Payer: Self-pay

## 2018-06-03 ENCOUNTER — Encounter: Payer: Self-pay | Admitting: Hematology

## 2018-06-03 ENCOUNTER — Inpatient Hospital Stay: Payer: PPO | Attending: Hematology | Admitting: Hematology

## 2018-06-03 ENCOUNTER — Other Ambulatory Visit: Payer: Self-pay | Admitting: Hematology

## 2018-06-03 ENCOUNTER — Inpatient Hospital Stay: Payer: PPO

## 2018-06-03 VITALS — BP 134/57 | HR 84 | Temp 100.4°F | Resp 17 | Ht 68.0 in | Wt 191.0 lb

## 2018-06-03 DIAGNOSIS — N402 Nodular prostate without lower urinary tract symptoms: Secondary | ICD-10-CM | POA: Diagnosis not present

## 2018-06-03 DIAGNOSIS — D472 Monoclonal gammopathy: Secondary | ICD-10-CM | POA: Insufficient documentation

## 2018-06-03 DIAGNOSIS — M889 Osteitis deformans of unspecified bone: Secondary | ICD-10-CM | POA: Diagnosis not present

## 2018-06-03 DIAGNOSIS — R509 Fever, unspecified: Secondary | ICD-10-CM | POA: Diagnosis not present

## 2018-06-03 NOTE — Progress Notes (Signed)
[facility] OFFICE PROGRESS NOTE  Patient Care Team: Maryella Shivers, MD as PCP - General (Family Medicine)  HEME/ONC OVERVIEW: 1. IgG lambda MGUS  -03/2018: SPEP by rheumatology shwoed a faint restricted band in the gamma globulin region; no quantifiable M-spike or IFE done  -05/2018: baseline labs:  Hgb 15.6, Cr 0.99, Ca normal  M-spike 0.2 g/dL, monoclonal IgG lambda; free lambda 60; quant IgG 891; UPEP negative; B2M 1.4   PET negative for bony disease   ASSESSMENT & PLAN:   IgG lambda MGUS -I independently reviewed the radiologic images of recent PET, and agree with the findings as documented in -In summary, PET showed no evidence of hypermetabolic bony disease to suggest multiple myeloma; stable pagetoid changes in the L1 vertebral body -I obtained records from [facility]; in summary, patient had MRI of the lumbar spine in 1997, which showed abnormal changes within the L1 vertebral body, for which he underwent biopsy at that time and showed no evidence of malignancy  -Given the normal Hgb, creatinine, and calcium, as well as absence of bony disease on PET, this is most consistent with his MGUS -Bone marrow biopsy is likely low yield due to the absence of CRAB criteria, and we would defer the procedure for now  Fever -New; patient was noted with fever of 100.4 in clinic -He denies any symptoms of rhinorrhea, sore throat, cough, sputum production, diarrhea, hematuria or dysuria -I recommended supportive care for now, including hydration and PRN OTC Tylenol -If his fever persists or he develops other symptoms of infection, I encouraged the patient to follow up with his PCP for further management   Incidental prostate findings -PET in 05/2018 showed incidental prostatomegaly with accentuated activity on the left posterolateral prostate apex peripheral zone, significance unclear -Patient reports that he had prostate biopsy in late 2019 that was negative  for malignancy, and has follow-up with his urologist next month -I will defer further management to urology   Paget's disease of the bone -On Reclast infusion by rheumatology -Records from [facility] as outlined above -No significant bone pain on exam -Continue follow-up with rheumatology for the management of Paget's disease of the bone    Orders Placed This Encounter  Procedures  . CBC with Differential (Cancer Center Only)    Standing Status:   Future    Standing Expiration Date:   07/08/2019  . CMP ([facility] only)    Standing Status:   Future    Standing Expiration Date:   07/08/2019  . Multiple Myeloma Panel (SPEP&IFE w/QIG)    Standing Status:   Future    Standing Expiration Date:   07/08/2019  . Kappa/lambda light chains    Standing Status:   Future    Standing Expiration Date:   12/04/2019   All questions were answered. The patient knows to call the clinic with any problems, questions or concerns. No barriers to learning was detected.  Return in 6 months for labs and clinic follow-up.   [name], MD 06/03/2018 2:30 PM  CHIEF COMPLAINT: "I am here to find out my PET results"  INTERVAL HISTORY: Mr. Lance King returns to clinic for follow-up of MGUS.  He recently had a PET scan done, which showed no evidence of bony disease.  He reports doing well until last night, when he began to experience some chills.  He was found with a fever of 100.4 today in clinic.  He denies any headache, rhinorrhea, sore throat, cough, sputum production, chest  pain, dyspnea, abdominal pain, nausea, vomiting, diarrhea, hematuria, or dysuria.  REVIEW OF SYSTEMS:   Constitutional: ( + ) fevers, ( - )  chills , ( - ) night sweats Eyes: ( - ) blurriness of vision, ( - ) double vision, ( - ) watery eyes Ears, nose, mouth, throat, and face: ( - ) mucositis, ( - ) sore throat Respiratory: ( - ) cough, ( - ) dyspnea, ( - ) wheezes Cardiovascular: ( - ) palpitation, ( - ) chest discomfort, ( - )  lower extremity swelling Gastrointestinal:  ( - ) nausea, ( - ) heartburn, ( - ) change in bowel habits Skin: ( - ) abnormal skin rashes Lymphatics: ( - ) new lymphadenopathy, ( - ) easy bruising Neurological: ( - ) numbness, ( - ) tingling, ( - ) new weaknesses Behavioral/Psych: ( - ) mood change, ( - ) new changes  All other systems were reviewed with the patient and are negative.  I have reviewed the past medical history, past surgical history, social history and family history with the patient and they are unchanged from previous note.  ALLERGIES:  has No Known Allergies.  MEDICATIONS:  Current Outpatient Medications  Medication Sig Dispense Refill  . finasteride (PROSCAR) 5 MG tablet finasteride 5 mg tablet    . lisinopril (PRINIVIL,ZESTRIL) 20 MG tablet lisinopril 20 mg tablet    . tadalafil (CIALIS) 5 MG tablet tadalafil 5 mg tablet  TAKE 1 TABLET BY MOUTH DAILY.    Marland Kitchen Vitamin D, Ergocalciferol, (DRISDOL) 1.25 MG (50000 UT) CAPS capsule Take 1 capsule (50,000 Units total) by mouth every 14 (fourteen) days. 6 capsule 1   No current facility-administered medications for this visit.     PHYSICAL EXAMINATION: ECOG PERFORMANCE STATUS: 0 - Asymptomatic  Today's Vitals   06/03/18 1410 06/03/18 1427  BP: (!) 134/57   Pulse: 84   Resp: 17   Temp: (!) 100.4 F (38 C)   TempSrc: Oral   SpO2: 100%   Weight: 191 lb (86.6 kg)   Height: 5' 8" (1.727 m)   PainSc:  0-No pain   Body mass index is 29.04 kg/m.  Filed Weights   06/03/18 1410  Weight: 191 lb (86.6 kg)    GENERAL: alert, no distress and comfortable, well appearing  SKIN: skin color, texture, turgor are normal, no rashes or significant lesions EYES: conjunctiva are pink and non-injected, sclera clear OROPHARYNX: no exudate, no erythema; lips, buccal mucosa, and tongue normal  NECK: supple, non-tender LUNGS: clear to auscultation with normal breathing effort HEART: regular rate & rhythm and no murmurs and no lower  extremity edema ABDOMEN: soft, non-tender, non-distended, normal bowel sounds Musculoskeletal: no cyanosis of digits and no clubbing  PSYCH: alert & oriented x 3, fluent speech NEURO: no focal motor/sensory deficits  LABORATORY DATA:  I have reviewed the data as listed    Component Value Date/Time   NA 143 05/18/2018 1158   K 4.2 05/18/2018 1158   CL 106 05/18/2018 1158   CO2 29 05/18/2018 1158   GLUCOSE 98 05/18/2018 1158   BUN 11 05/18/2018 1158   CREATININE 0.99 05/18/2018 1158   CREATININE 0.99 05/11/2018 0940   CALCIUM 9.5 05/18/2018 1158   PROT 6.5 05/18/2018 1158   ALBUMIN 4.4 05/18/2018 1158   AST 16 05/18/2018 1158   ALT 15 05/18/2018 1158   ALKPHOS 165 (H) 05/18/2018 1158   BILITOT 0.9 05/18/2018 1158   GFRNONAA >60 05/18/2018 1158   GFRNONAA 78 05/11/2018 0940  GFRAA >60 05/18/2018 1158   GFRAA 90 05/11/2018 0940    No results found for: SPEP, UPEP  Lab Results  Component Value Date   WBC 6.2 05/18/2018   NEUTROABS 3.7 05/18/2018   HGB 15.6 05/18/2018   HCT 47.9 05/18/2018   MCV 83.6 05/18/2018   PLT 323 05/18/2018      Chemistry      Component Value Date/Time   NA 143 05/18/2018 1158   K 4.2 05/18/2018 1158   CL 106 05/18/2018 1158   CO2 29 05/18/2018 1158   BUN 11 05/18/2018 1158   CREATININE 0.99 05/18/2018 1158   CREATININE 0.99 05/11/2018 0940      Component Value Date/Time   CALCIUM 9.5 05/18/2018 1158   ALKPHOS 165 (H) 05/18/2018 1158   AST 16 05/18/2018 1158   ALT 15 05/18/2018 1158   BILITOT 0.9 05/18/2018 1158       RADIOGRAPHIC STUDIES: I have personally reviewed the radiological images as listed below and agreed with the findings in the report. Nm Pet Image Initial (pi) Whole Body  Result Date: 06/03/2018 CLINICAL DATA:  Initial treatment strategy for monoclonal gammopathy. EXAM: NUCLEAR MEDICINE PET WHOLE BODY TECHNIQUE: 10.0 mCi F-18 FDG was injected intravenously. Full-ring PET imaging was performed from the skull base to  thigh after the radiotracer. CT data was obtained and used for attenuation correction and anatomic localization. Fasting blood glucose: 82 mg/dl COMPARISON:  Bone scan dated 04/19/2018 and MRI dated 01/11/2010 FINDINGS: Mediastinal blood pool activity: SUV max 2.7 HEAD/NECK: No significant abnormal hypermetabolic activity in this region. Incidental CT findings: none CHEST: No significant abnormal hypermetabolic activity in this region. Incidental CT findings: Atherosclerotic calcification of the aortic arch. ABDOMEN/PELVIS: Prostatomegaly with accentuated activity along the left posterior prostate apex, maximum SUV 6.3. Incidental CT findings: Cholelithiasis. Possible 8 mm rim calcified right renal artery aneurysm on image 142/4. Sigmoid diverticulosis. SKELETON: Very subtle accentuated activity eccentric to the left in the L1 vertebral body which demonstrates typical pagetoid features, as been has been shown on multiple prior exams including the lumbar MRI from 02/11/2010. An oval-shaped lucency the base of the right acromion measures 1.0 by 0.7 cm on image 67/4 and does not have definite appreciable accentuated metabolic activity. Incidental CT findings: Lower lumbar laminectomies. Bridging spurring of the sacroiliac joints. Degenerative spurring of both hips. EXTREMITIES: No significant abnormal hypermetabolic activity in this region. Incidental CT findings: No supplemental non-categorized findings. IMPRESSION: 1. No hypermetabolic lesions suspicious for active myeloma identified. There is a small lucent lesion in the right acromion which has no appreciable accentuated activity in which is technically nonspecific. Also, pagetoid findings in the L1 vertebra are chronically stable. 2. Prostatomegaly with accentuated activity along the left posterolateral prostate apex peripheral zone. Correlate with PSA level and patient history in determining whether further workup is warranted. 3. Other imaging findings of  potential clinical significance: Aortic Atherosclerosis (ICD10-I70.0). Cholelithiasis. Degenerative spurring of the hips. Possible 8 mm rim calcified right renal artery aneurysm. Sigmoid diverticulosis. Electronically Signed   By: Van Clines M.D.   On: 06/03/2018 08:54

## 2018-07-12 DIAGNOSIS — R972 Elevated prostate specific antigen [PSA]: Secondary | ICD-10-CM | POA: Diagnosis not present

## 2018-07-12 DIAGNOSIS — M47817 Spondylosis without myelopathy or radiculopathy, lumbosacral region: Secondary | ICD-10-CM | POA: Diagnosis not present

## 2018-07-12 DIAGNOSIS — N401 Enlarged prostate with lower urinary tract symptoms: Secondary | ICD-10-CM | POA: Diagnosis not present

## 2018-07-12 DIAGNOSIS — Z1212 Encounter for screening for malignant neoplasm of rectum: Secondary | ICD-10-CM | POA: Diagnosis not present

## 2018-07-12 DIAGNOSIS — M1711 Unilateral primary osteoarthritis, right knee: Secondary | ICD-10-CM | POA: Diagnosis not present

## 2018-07-12 DIAGNOSIS — I10 Essential (primary) hypertension: Secondary | ICD-10-CM | POA: Diagnosis not present

## 2018-08-05 DIAGNOSIS — N402 Nodular prostate without lower urinary tract symptoms: Secondary | ICD-10-CM | POA: Diagnosis not present

## 2018-08-05 DIAGNOSIS — I10 Essential (primary) hypertension: Secondary | ICD-10-CM | POA: Diagnosis not present

## 2018-08-05 DIAGNOSIS — R972 Elevated prostate specific antigen [PSA]: Secondary | ICD-10-CM | POA: Diagnosis not present

## 2018-08-05 DIAGNOSIS — D472 Monoclonal gammopathy: Secondary | ICD-10-CM | POA: Diagnosis not present

## 2018-10-26 NOTE — Progress Notes (Deleted)
Office Visit Note  Patient: Lance King             Date of Birth: 09-Dec-1950           MRN: [id number]             PCP: Maryella Shivers, MD Referring: Maryella Shivers, MD Visit Date: 11/09/2018 Occupation: '@GUAROCC' @  Subjective:  No chief complaint on file.  He received Reclast 5 mg infusion on 06/01/2018.  He is also on a vitamin D  supplement daily.  Last CMP showed alk phos of 165 on 05/18/2018.  Due for CMP and will monitor every 3 months.  Standing orders placed.  History of Present Illness: Lance King is a 68 y.o. male ***   Activities of Daily Living:  Patient reports morning stiffness for *** {minute/hour:19697}.   Patient {ACTIONS;DENIES/REPORTS:21021675::"Denies"} nocturnal pain.  Difficulty dressing/grooming: {ACTIONS;DENIES/REPORTS:21021675::"Denies"} Difficulty climbing stairs: {ACTIONS;DENIES/REPORTS:21021675::"Denies"} Difficulty getting out of chair: {ACTIONS;DENIES/REPORTS:21021675::"Denies"} Difficulty using hands for taps, buttons, cutlery, and/or writing: {ACTIONS;DENIES/REPORTS:21021675::"Denies"}  No Rheumatology ROS completed.   PMFS History:  Patient Active Problem List   Diagnosis Date Noted  . Paget's disease of bone 04/20/2018  . MGUS (monoclonal gammopathy of unknown significance) 04/20/2018  . Vitamin D deficiency 04/20/2018  . DDD (degenerative disc disease), lumbar 04/06/2018  . Primary osteoarthritis of right knee 04/06/2018  . Adhesive capsulitis of right shoulder 04/06/2018  . Essential hypertension 04/06/2018    Past Medical History:  Diagnosis Date  . Complication of anesthesia    states a smaller tube has been used in the past due to sore throat  . Hypertension    EKG PCP 02/05/11 on chart    Family History  Problem Relation Age of Onset  . Hypertension Mother   . Hypertension Father    Past Surgical History:  Procedure Laterality Date  . BACK SURGERY     Lumbar decompression 1994  . KNEE ARTHROSCOPY     1987  .  LUMBAR LAMINECTOMY/DECOMPRESSION MICRODISCECTOMY  03/05/2011   Procedure: LUMBAR LAMINECTOMY/DECOMPRESSION MICRODISCECTOMY;  Surgeon: Johnn Hai;  Location: [facility];  Service: Orthopedics;  Laterality: N/A;  Re do Decompression L3-L5  (X-Ray)    Central   Social History   Social History Narrative  . Not on file   There is no immunization history for the selected administration types on file for this patient.   Objective: Vital Signs: There were no vitals taken for this visit.   Physical Exam   Musculoskeletal Exam: ***  CDAI Exam: CDAI Score: - Patient Global: -; Provider Global: - Swollen: -; Tender: - Joint Exam   No joint exam has been documented for this visit   There is currently no information documented on the homunculus. Go to the Rheumatology activity and complete the homunculus joint exam.  Investigation: No additional findings.  Imaging: No results found.  Recent Labs: Lab Results  Component Value Date   WBC 6.2 05/18/2018   HGB 15.6 05/18/2018   PLT 323 05/18/2018   NA 143 05/18/2018   K 4.2 05/18/2018   CL 106 05/18/2018   CO2 29 05/18/2018   GLUCOSE 98 05/18/2018   BUN 11 05/18/2018   CREATININE 0.99 05/18/2018   BILITOT 0.9 05/18/2018   ALKPHOS 165 (H) 05/18/2018   AST 16 05/18/2018   ALT 15 05/18/2018   PROT 6.5 05/18/2018   ALBUMIN 4.4 05/18/2018   CALCIUM 9.5 05/18/2018   GFRAA >60 05/18/2018    Speciality Comments: No specialty comments available.  Procedures:  No procedures performed Allergies: Patient has no known allergies.   Assessment / Plan:     Visit Diagnoses: No diagnosis found.  Orders: No orders of the defined types were placed in this encounter.  No orders of the defined types were placed in this encounter.   Face-to-face time spent with patient was *** minutes. Greater than 50% of time was spent in counseling and coordination of care.  Follow-Up Instructions: No follow-ups on file.   Earnestine Mealing, CMA   Note - This record has been created using Editor, commissioning.  Chart creation errors have been sought, but may not always  have been located. Such creation errors do not reflect on  the standard of medical care.

## 2018-11-04 NOTE — Progress Notes (Signed)
Office Visit Note  Patient: Lance King             Date of Birth: 03-Aug-1950           MRN: [id number]             PCP: Maryella Shivers, MD Referring: Maryella Shivers, MD Visit Date: 11/11/2018 Occupation: @GUAROCC @  Subjective:  Medication monitoring.   History of Present Illness: Lance King is a 68 y.o. male with history of Paget's disease and degenerative disc disease.  He states he had Reclast infusion in March.  He had no side effects from the medication.  He has not noticed any change in his symptoms as he did not have any lower back pain.  He states he had a PET scan as well which was normal.  He denies any joint pain.  Activities of Daily Living:  Patient reports morning stiffness for 2 minutes.   Patient Denies nocturnal pain.  Difficulty dressing/grooming: Denies Difficulty climbing stairs: Denies Difficulty getting out of chair: Denies Difficulty using hands for taps, buttons, cutlery, and/or writing: Denies  Review of Systems  Constitutional: Negative for fatigue and night sweats.  HENT: Negative for mouth sores, mouth dryness and nose dryness.   Eyes: Negative for redness and dryness.  Respiratory: Negative for shortness of breath and difficulty breathing.   Cardiovascular: Negative for chest pain, palpitations, hypertension, irregular heartbeat and swelling in legs/feet.  Gastrointestinal: Negative for constipation and diarrhea.  Endocrine: Negative for increased urination.  Musculoskeletal: Negative for arthralgias, joint pain, joint swelling, myalgias, muscle weakness, morning stiffness, muscle tenderness and myalgias.  Skin: Negative for color change, rash, hair loss, nodules/bumps, skin tightness, ulcers and sensitivity to sunlight.  Allergic/Immunologic: Negative for susceptible to infections.  Neurological: Negative for dizziness, fainting, memory loss, night sweats and weakness ( ).  Hematological: Negative for swollen glands.   Psychiatric/Behavioral: Negative for depressed mood and sleep disturbance. The patient is not nervous/anxious.     PMFS History:  Patient Active Problem List   Diagnosis Date Noted  . Paget's disease of bone 04/20/2018  . MGUS (monoclonal gammopathy of unknown significance) 04/20/2018  . Vitamin D deficiency 04/20/2018  . DDD (degenerative disc disease), lumbar 04/06/2018  . Primary osteoarthritis of right knee 04/06/2018  . Adhesive capsulitis of right shoulder 04/06/2018  . Essential hypertension 04/06/2018    Past Medical History:  Diagnosis Date  . Complication of anesthesia    states a smaller tube has been used in the past due to sore throat  . Hypertension    EKG PCP 02/05/11 on chart    Family History  Problem Relation Age of Onset  . Hypertension Mother   . Hypertension Father    Past Surgical History:  Procedure Laterality Date  . BACK SURGERY     Lumbar decompression 1994  . KNEE ARTHROSCOPY     1987  . LUMBAR LAMINECTOMY/DECOMPRESSION MICRODISCECTOMY  03/05/2011   Procedure: LUMBAR LAMINECTOMY/DECOMPRESSION MICRODISCECTOMY;  Surgeon: Johnn Hai;  Location: [facility];  Service: Orthopedics;  Laterality: N/A;  Re do Decompression L3-L5  (X-Ray)    [facility]   Social History   Social History Narrative  . Not on file   There is no immunization history for the selected administration types on file for this patient.   Objective: Vital Signs: BP (!) 171/93 (BP Location: Left Arm, Patient Position: Sitting, Cuff Size: Small) Comment: Patient has not had blood pressure medication today.  Pulse (!) 54   Resp 12  Ht 5\' 8"  (1.727 m)   Wt 191 lb 9.6 oz (86.9 kg)   BMI 29.13 kg/m    Physical Exam Vitals signs and nursing note reviewed.  Constitutional:      Appearance: He is well-developed.  HENT:     Head: Normocephalic and atraumatic.  Eyes:     Conjunctiva/sclera: Conjunctivae normal.     Pupils: Pupils are equal, round, and reactive to light.  Neck:      Musculoskeletal: Normal range of motion and neck supple.  Cardiovascular:     Rate and Rhythm: Normal rate and regular rhythm.     Heart sounds: Normal heart sounds.  Pulmonary:     Effort: Pulmonary effort is normal.     Breath sounds: Normal breath sounds.  Abdominal:     General: Bowel sounds are normal.     Palpations: Abdomen is soft.  Skin:    General: Skin is warm and dry.     Capillary Refill: Capillary refill takes less than 2 seconds.  Neurological:     Mental Status: He is alert and oriented to person, place, and time.  Psychiatric:        Behavior: Behavior normal.      Musculoskeletal Exam: C-spine, thoracic and lumbar spine with good range of motion.  Shoulder joints, elbow joints, wrist joints, MCPs PIPs DIPs with good range of motion with no synovitis.  Hip joints, knee joints, ankles MTPs PIPs DIPs with good range of motion with no synovitis.  He had no tenderness over L1 vertebra.  CDAI Exam: CDAI Score: - Patient Global: -; Provider Global: - Swollen: -; Tender: - Joint Exam   No joint exam has been documented for this visit   There is currently no information documented on the homunculus. Go to the Rheumatology activity and complete the homunculus joint exam.  Investigation: No additional findings.  Imaging: No results found.  Recent Labs: Lab Results  Component Value Date   WBC 6.2 05/18/2018   HGB 15.6 05/18/2018   PLT 323 05/18/2018   NA 143 05/18/2018   K 4.2 05/18/2018   CL 106 05/18/2018   CO2 29 05/18/2018   GLUCOSE 98 05/18/2018   BUN 11 05/18/2018   CREATININE 0.99 05/18/2018   BILITOT 0.9 05/18/2018   ALKPHOS 165 (H) 05/18/2018   AST 16 05/18/2018   ALT 15 05/18/2018   PROT 6.5 05/18/2018   ALBUMIN 4.4 05/18/2018   CALCIUM 9.5 05/18/2018   GFRAA >60 05/18/2018    Speciality Comments: No specialty comments available.  Procedures:  No procedures performed Allergies: Patient has no known allergies.   Assessment / Plan:      Visit Diagnoses: Paget's disease of bone - the total body bone scan confirmed the diagnosis of Paget's diease localized to L1 on 04/19/18. -Patient had Reclast infusion in March 2020.  He tolerated the medication well.  He has not noticed any difference in his symptoms.  He states he never had lower back pain.  Plan: VITAMIN D 25 Hydroxy (Vit-D Deficiency, Fractures),   Vitamin D deficiency -he has been taking vitamin D 50,000 units twice a month on regular basis.  We will check his vitamin D level today.  Plan: VITAMIN D 25 Hydroxy (Vit-D Deficiency, Fractures),   Abnormal SPEP -he has been followed by heme-onc for MGUS.  Primary osteoarthritis of right knee -currently not very symptomatic.  DDD (degenerative disc disease), lumbar -patient has no lower back discomfort.  Essential hypertension -patient's blood pressure is elevated.  He has been advised to monitor blood pressure and follow-up with his PCP.  Medication monitoring encounter - Plan: COMPLETE METABOLIC PANEL WITH GFR,   Orders: Orders Placed This Encounter  Procedures  . COMPLETE METABOLIC PANEL WITH GFR  . VITAMIN D 25 Hydroxy (Vit-D Deficiency, Fractures)   No orders of the defined types were placed in this encounter.     Follow-Up Instructions: Return in about 1 year (around 11/11/2019) for Paget's, DDD.   Bo Merino, MD  Note - This record has been created using Editor, commissioning.  Chart creation errors have been sought, but may not always  have been located. Such creation errors do not reflect on  the standard of medical care.

## 2018-11-09 ENCOUNTER — Ambulatory Visit: Payer: Self-pay | Admitting: Rheumatology

## 2018-11-11 ENCOUNTER — Encounter: Payer: Self-pay | Admitting: Rheumatology

## 2018-11-11 ENCOUNTER — Ambulatory Visit: Payer: PPO | Admitting: Rheumatology

## 2018-11-11 ENCOUNTER — Other Ambulatory Visit: Payer: Self-pay

## 2018-11-11 VITALS — BP 171/93 | HR 54 | Resp 12 | Ht 68.0 in | Wt 191.6 lb

## 2018-11-11 DIAGNOSIS — M889 Osteitis deformans of unspecified bone: Secondary | ICD-10-CM

## 2018-11-11 DIAGNOSIS — M1711 Unilateral primary osteoarthritis, right knee: Secondary | ICD-10-CM

## 2018-11-11 DIAGNOSIS — E559 Vitamin D deficiency, unspecified: Secondary | ICD-10-CM | POA: Diagnosis not present

## 2018-11-11 DIAGNOSIS — R778 Other specified abnormalities of plasma proteins: Secondary | ICD-10-CM

## 2018-11-11 DIAGNOSIS — I10 Essential (primary) hypertension: Secondary | ICD-10-CM | POA: Diagnosis not present

## 2018-11-11 DIAGNOSIS — Z5181 Encounter for therapeutic drug level monitoring: Secondary | ICD-10-CM | POA: Diagnosis not present

## 2018-11-11 DIAGNOSIS — M5136 Other intervertebral disc degeneration, lumbar region: Secondary | ICD-10-CM

## 2018-11-12 LAB — COMPLETE METABOLIC PANEL WITH GFR
AG Ratio: 2 (calc) (ref 1.0–2.5)
ALT: 20 U/L (ref 9–46)
AST: 24 U/L (ref 10–35)
Albumin: 4.2 g/dL (ref 3.6–5.1)
Alkaline phosphatase (APISO): 52 U/L (ref 35–144)
BUN: 8 mg/dL (ref 7–25)
CO2: 28 mmol/L (ref 20–32)
Calcium: 9.4 mg/dL (ref 8.6–10.3)
Chloride: 105 mmol/L (ref 98–110)
Creat: 0.84 mg/dL (ref 0.70–1.25)
GFR, Est African American: 104 mL/min/{1.73_m2} (ref >=60)
GFR, Est Non African American: 89 mL/min/{1.73_m2} (ref >=60)
Globulin: 2.1 g/dL (calc) (ref 1.9–3.7)
Glucose, Bld: 89 mg/dL (ref 65–99)
Potassium: 4.4 mmol/L (ref 3.5–5.3)
Sodium: 140 mmol/L (ref 135–146)
Total Bilirubin: 1.3 mg/dL — ABNORMAL HIGH (ref 0.2–1.2)
Total Protein: 6.3 g/dL (ref 6.1–8.1)

## 2018-11-12 LAB — VITAMIN D 25 HYDROXY (VIT D DEFICIENCY, FRACTURES): Vit D, 25-Hydroxy: 29 ng/mL — ABNORMAL LOW (ref 30–100)

## 2018-11-12 NOTE — Progress Notes (Signed)
Patient is on fifth vitamin D 50,000 units twice a month.  Please make sure that he is taking it on a regular basis.

## 2018-11-15 NOTE — Progress Notes (Signed)
Add 2000 units of vitamin D daily to his regimen.

## 2018-11-23 DIAGNOSIS — N529 Male erectile dysfunction, unspecified: Secondary | ICD-10-CM | POA: Diagnosis not present

## 2018-11-23 DIAGNOSIS — N401 Enlarged prostate with lower urinary tract symptoms: Secondary | ICD-10-CM | POA: Diagnosis not present

## 2018-11-23 DIAGNOSIS — R972 Elevated prostate specific antigen [PSA]: Secondary | ICD-10-CM | POA: Diagnosis not present

## 2018-12-07 ENCOUNTER — Encounter: Payer: Self-pay | Admitting: Hematology

## 2018-12-07 ENCOUNTER — Inpatient Hospital Stay: Payer: PPO

## 2018-12-07 ENCOUNTER — Other Ambulatory Visit: Payer: Self-pay

## 2018-12-07 ENCOUNTER — Inpatient Hospital Stay: Payer: PPO | Attending: Hematology | Admitting: Hematology

## 2018-12-07 VITALS — BP 175/75 | HR 62 | Temp 97.5°F | Resp 18 | Ht 68.0 in | Wt 192.1 lb

## 2018-12-07 DIAGNOSIS — D472 Monoclonal gammopathy: Secondary | ICD-10-CM

## 2018-12-07 DIAGNOSIS — R9389 Abnormal findings on diagnostic imaging of other specified body structures: Secondary | ICD-10-CM

## 2018-12-07 DIAGNOSIS — M889 Osteitis deformans of unspecified bone: Secondary | ICD-10-CM

## 2018-12-07 DIAGNOSIS — R972 Elevated prostate specific antigen [PSA]: Secondary | ICD-10-CM | POA: Diagnosis not present

## 2018-12-07 LAB — CBC WITH DIFFERENTIAL (CANCER CENTER ONLY)
Abs Immature Granulocytes: 0.03 10*3/uL (ref 0.00–0.07)
Basophils Absolute: 0 10*3/uL (ref 0.0–0.1)
Basophils Relative: 1 %
Eosinophils Absolute: 0.1 10*3/uL (ref 0.0–0.5)
Eosinophils Relative: 2 %
HCT: 51.1 % (ref 39.0–52.0)
Hemoglobin: 16.4 g/dL (ref 13.0–17.0)
Immature Granulocytes: 1 %
Lymphocytes Relative: 27 %
Lymphs Abs: 1.6 10*3/uL (ref 0.7–4.0)
MCH: 27.2 pg (ref 26.0–34.0)
MCHC: 32.1 g/dL (ref 30.0–36.0)
MCV: 84.9 fL (ref 80.0–100.0)
Monocytes Absolute: 0.7 10*3/uL (ref 0.1–1.0)
Monocytes Relative: 11 %
Neutro Abs: 3.6 10*3/uL (ref 1.7–7.7)
Neutrophils Relative %: 58 %
Platelet Count: 248 10*3/uL (ref 150–400)
RBC: 6.02 MIL/uL — ABNORMAL HIGH (ref 4.22–5.81)
RDW: 14.5 % (ref 11.5–15.5)
WBC Count: 6 10*3/uL (ref 4.0–10.5)
nRBC: 0 % (ref 0.0–0.2)

## 2018-12-07 LAB — CMP (CANCER CENTER ONLY)
ALT: 21 U/L (ref 0–44)
AST: 21 U/L (ref 15–41)
Albumin: 4.4 g/dL (ref 3.5–5.0)
Alkaline Phosphatase: 58 U/L (ref 38–126)
Anion gap: 5 (ref 5–15)
BUN: 11 mg/dL (ref 8–23)
CO2: 30 mmol/L (ref 22–32)
Calcium: 9.4 mg/dL (ref 8.9–10.3)
Chloride: 104 mmol/L (ref 98–111)
Creatinine: 1.19 mg/dL (ref 0.61–1.24)
GFR, Est AFR Am: >60 mL/min (ref >60)
GFR, Estimated: >60 mL/min (ref >60)
Glucose, Bld: 81 mg/dL (ref 70–99)
Potassium: 3.9 mmol/L (ref 3.5–5.1)
Sodium: 139 mmol/L (ref 135–145)
Total Bilirubin: 1.1 mg/dL (ref 0.3–1.2)
Total Protein: 6.7 g/dL (ref 6.5–8.1)

## 2018-12-07 NOTE — Progress Notes (Signed)
[place] OFFICE PROGRESS NOTE  Patient Care Team: Maryella Shivers, MD as PCP - General (Family Medicine)  HEME/ONC OVERVIEW: 1. IgG lambda MGUS  -03/2018: SPEP by rheumatology shwoed a faint restricted band in the gamma globulin region; no quantifiable M-spike or IFE done  -05/2018: baseline labs:  Hgb 15.6, Cr 0.99, Ca normal  M-spike 0.2 g/dL, monoclonal IgG lambda; free lambda 60; quant IgG 891; UPEP negative; B2M 1.4   PET negative for bony disease   Review of L1 bx in 1990's negative for malignancy -On observation  TREATMENT REGIMEN:  Observation   ASSESSMENT & PLAN:   IgG lambda MGUS -Currently on observation -MM panel pending today -Clinically, patient denies any constitutional symptoms -Given the low M-protein level, we can monitor his MM panel once a year, if his MM panel today remains unchanged  -No bone marrow bx at this time, given the absence of CRAB criteria and low likelihood of multiple myeloma   Abnormal prostate on PET  -PET in 05/2018 showed incidental prostatomegaly with accentuated activity on the left posterolateral prostate apex peripheral zone -PSA ~5, and prostate MRI was ordered by his urologist (Dr. Nila Nephew) in [place] -I encouraged the patient to continue follow-up with urology as scheduled   Paget's disease of the bone -On Reclast infusion by rheumatology -No significant bone pain on exam -Continue follow-up with rheumatology for the management of Paget's disease of the bone    Orders Placed This Encounter  Procedures  . CBC with Differential (Cancer Center Only)    Standing Status:   Future    Standing Expiration Date:   01/11/2020  . CMP ([facility] only)    Standing Status:   Future    Standing Expiration Date:   01/11/2020  . Multiple Myeloma Panel (SPEP&IFE w/QIG)    Standing Status:   Future    Standing Expiration Date:   01/11/2020  . Kappa/lambda light chains    Standing Status:   Future    Standing  Expiration Date:   06/05/2020   All questions were answered. The patient knows to call the clinic with any problems, questions or concerns. No barriers to learning was detected.  Return in 1 year for labs and clinic follow-up.   [name], MD 12/07/2018 10:59 AM  CHIEF COMPLAINT: "I am doing great"  INTERVAL HISTORY: Lance King returns to clinic for follow-up of IgG lambda MGUS today.  He reports that he was recently seen by Dr. Nila Nephew of urology referral, MRI of the prostate (not yet scheduled) due to mildly elevated PSA around 5.  He denies any abnormal urinary symptoms, such as dysuria or hematuria.  He is also followed by rheumatology for Paget's disease of the bone.  Throughout class.  He does regular fishing trips and is very active.  He denies any complaint today.  REVIEW OF SYSTEMS:   Constitutional: ( - ) fevers, ( - )  chills , ( - ) night sweats Eyes: ( - ) blurriness of vision, ( - ) double vision, ( - ) watery eyes Ears, nose, mouth, throat, and face: ( - ) mucositis, ( - ) sore throat Respiratory: ( - ) cough, ( - ) dyspnea, ( - ) wheezes Cardiovascular: ( - ) palpitation, ( - ) chest discomfort, ( - ) lower extremity swelling Gastrointestinal:  ( - ) nausea, ( - ) heartburn, ( - ) change in bowel habits Skin: ( - ) abnormal skin rashes Lymphatics: ( - ) new lymphadenopathy, ( - )  easy bruising Neurological: ( - ) numbness, ( - ) tingling, ( - ) new weaknesses Behavioral/Psych: ( - ) mood change, ( - ) new changes  All other systems were reviewed with the patient and are negative.  I have reviewed the past medical history, past surgical history, social history and family history with the patient and they are unchanged from previous note.  ALLERGIES:  has No Known Allergies.  MEDICATIONS:  Current Outpatient Medications  Medication Sig Dispense Refill  . finasteride (PROSCAR) 5 MG tablet finasteride 5 mg tablet    . lisinopril (PRINIVIL,ZESTRIL) 20 MG tablet lisinopril 20  mg tablet    . tadalafil (CIALIS) 5 MG tablet tadalafil 5 mg tablet  TAKE 1 TABLET BY MOUTH DAILY.    Marland Kitchen Vitamin D, Ergocalciferol, (DRISDOL) 1.25 MG (50000 UT) CAPS capsule Take 1 capsule (50,000 Units total) by mouth every 14 (fourteen) days. 6 capsule 1   No current facility-administered medications for this visit.     PHYSICAL EXAMINATION: ECOG PERFORMANCE STATUS: 0 - Asymptomatic  Today's Vitals   12/07/18 1038  BP: (!) 175/75  Pulse: 62  Resp: 18  Temp: (!) 97.5 F (36.4 C)  TempSrc: Temporal  SpO2: 100%  Weight: 192 lb 1.9 oz (87.1 kg)  Height: '5\' 8"'  (1.727 m)  PainSc: 0-No pain   Body mass index is 29.21 kg/m.  Filed Weights   12/07/18 1038  Weight: 192 lb 1.9 oz (87.1 kg)    GENERAL: alert, no distress and comfortable, well appearing  SKIN: skin color, texture, turgor are normal, no rashes or significant lesions EYES: conjunctiva are pink and non-injected, sclera clear OROPHARYNX: no exudate, no erythema; lips, buccal mucosa, and tongue normal  NECK: supple, non-tender LUNGS: clear to auscultation with normal breathing effort HEART: regular rate & rhythm and no murmurs and no lower extremity edema ABDOMEN: soft, non-tender, non-distended, normal bowel sounds Musculoskeletal: no cyanosis of digits and no clubbing  PSYCH: alert & oriented x 3, fluent speech NEURO: no focal motor/sensory deficits  LABORATORY DATA:  I have reviewed the data as listed    Component Value Date/Time   NA 139 12/07/2018 1013   K 3.9 12/07/2018 1013   CL 104 12/07/2018 1013   CO2 30 12/07/2018 1013   GLUCOSE 81 12/07/2018 1013   BUN 11 12/07/2018 1013   CREATININE 1.19 12/07/2018 1013   CREATININE 0.84 11/11/2018 1339   CALCIUM 9.4 12/07/2018 1013   PROT 6.7 12/07/2018 1013   ALBUMIN 4.4 12/07/2018 1013   AST 21 12/07/2018 1013   ALT 21 12/07/2018 1013   ALKPHOS 58 12/07/2018 1013   BILITOT 1.1 12/07/2018 1013   GFRNONAA >60 12/07/2018 1013   GFRNONAA 89 11/11/2018 1339    GFRAA >60 12/07/2018 1013   GFRAA 104 11/11/2018 1339    No results found for: SPEP, UPEP  Lab Results  Component Value Date   WBC 6.0 12/07/2018   NEUTROABS 3.6 12/07/2018   HGB 16.4 12/07/2018   HCT 51.1 12/07/2018   MCV 84.9 12/07/2018   PLT 248 12/07/2018      Chemistry      Component Value Date/Time   NA 139 12/07/2018 1013   K 3.9 12/07/2018 1013   CL 104 12/07/2018 1013   CO2 30 12/07/2018 1013   BUN 11 12/07/2018 1013   CREATININE 1.19 12/07/2018 1013   CREATININE 0.84 11/11/2018 1339      Component Value Date/Time   CALCIUM 9.4 12/07/2018 1013   ALKPHOS 58 12/07/2018 1013  AST 21 12/07/2018 1013   ALT 21 12/07/2018 1013   BILITOT 1.1 12/07/2018 1013       RADIOGRAPHIC STUDIES: I have personally reviewed the radiological images as listed below and agreed with the findings in the report. No results found.

## 2018-12-08 LAB — MULTIPLE MYELOMA PANEL, SERUM
Albumin SerPl Elph-Mcnc: 4 g/dL (ref 2.9–4.4)
Albumin/Glob SerPl: 1.7 (ref 0.7–1.7)
Alpha 1: 0.2 g/dL (ref 0.0–0.4)
Alpha2 Glob SerPl Elph-Mcnc: 0.7 g/dL (ref 0.4–1.0)
B-Globulin SerPl Elph-Mcnc: 0.9 g/dL (ref 0.7–1.3)
Gamma Glob SerPl Elph-Mcnc: 0.7 g/dL (ref 0.4–1.8)
Globulin, Total: 2.5 g/dL (ref 2.2–3.9)
IgA: 69 mg/dL (ref 61–437)
IgG (Immunoglobin G), Serum: 867 mg/dL (ref 603–1613)
IgM (Immunoglobulin M), Srm: 17 mg/dL — ABNORMAL LOW (ref 20–172)
M Protein SerPl Elph-Mcnc: 0.1 g/dL — ABNORMAL HIGH
Total Protein ELP: 6.5 g/dL (ref 6.0–8.5)

## 2018-12-08 LAB — KAPPA/LAMBDA LIGHT CHAINS
Kappa free light chain: 11.7 mg/L (ref 3.3–19.4)
Kappa, lambda light chain ratio: 0.15 — ABNORMAL LOW (ref 0.26–1.65)
Lambda free light chains: 76.1 mg/L — ABNORMAL HIGH (ref 5.7–26.3)

## 2018-12-09 ENCOUNTER — Telehealth: Payer: Self-pay | Admitting: *Deleted

## 2018-12-09 NOTE — Telephone Encounter (Signed)
Patient returned my phone call regarding lab results. Informed him that the M-protein is very, very low. Dr. Maylon Peppers will follow up with you in a year. He verbalized understanding.

## 2018-12-09 NOTE — Telephone Encounter (Signed)
-----   Message from [name], MD sent at 12/08/2018  2:57 PM EDT ----- Delrae Sawyers, Can you let Mr. Pierman know that his M-protein level is very very low, and we can see him back in 1 year as scheduled?Thanks!  [place] ----- Message ----- From: Buel Ream, Lab In [place] Sent: 12/07/2018  10:28 AM EDT To: [name], MD

## 2019-01-28 ENCOUNTER — Other Ambulatory Visit: Payer: Self-pay | Admitting: Urology

## 2019-01-28 DIAGNOSIS — R972 Elevated prostate specific antigen [PSA]: Secondary | ICD-10-CM

## 2019-02-01 DIAGNOSIS — R972 Elevated prostate specific antigen [PSA]: Secondary | ICD-10-CM | POA: Diagnosis not present

## 2019-02-01 DIAGNOSIS — Z13220 Encounter for screening for lipoid disorders: Secondary | ICD-10-CM | POA: Diagnosis not present

## 2019-02-01 DIAGNOSIS — I10 Essential (primary) hypertension: Secondary | ICD-10-CM | POA: Diagnosis not present

## 2019-02-10 DIAGNOSIS — Z6829 Body mass index (BMI) 29.0-29.9, adult: Secondary | ICD-10-CM | POA: Diagnosis not present

## 2019-02-10 DIAGNOSIS — Z1339 Encounter for screening examination for other mental health and behavioral disorders: Secondary | ICD-10-CM | POA: Diagnosis not present

## 2019-02-10 DIAGNOSIS — Z0000 Encounter for general adult medical examination without abnormal findings: Secondary | ICD-10-CM | POA: Diagnosis not present

## 2019-02-10 DIAGNOSIS — R972 Elevated prostate specific antigen [PSA]: Secondary | ICD-10-CM | POA: Diagnosis not present

## 2019-02-10 DIAGNOSIS — Z7189 Other specified counseling: Secondary | ICD-10-CM | POA: Diagnosis not present

## 2019-02-10 DIAGNOSIS — I10 Essential (primary) hypertension: Secondary | ICD-10-CM | POA: Diagnosis not present

## 2019-02-10 DIAGNOSIS — Z1331 Encounter for screening for depression: Secondary | ICD-10-CM | POA: Diagnosis not present

## 2019-02-10 DIAGNOSIS — Z23 Encounter for immunization: Secondary | ICD-10-CM | POA: Diagnosis not present

## 2019-03-27 DIAGNOSIS — Z20828 Contact with and (suspected) exposure to other viral communicable diseases: Secondary | ICD-10-CM | POA: Diagnosis not present

## 2019-04-25 DIAGNOSIS — R972 Elevated prostate specific antigen [PSA]: Secondary | ICD-10-CM | POA: Diagnosis not present

## 2019-04-25 DIAGNOSIS — N401 Enlarged prostate with lower urinary tract symptoms: Secondary | ICD-10-CM | POA: Diagnosis not present

## 2019-04-25 DIAGNOSIS — N529 Male erectile dysfunction, unspecified: Secondary | ICD-10-CM | POA: Diagnosis not present

## 2019-05-23 ENCOUNTER — Other Ambulatory Visit: Payer: Self-pay | Admitting: Urology

## 2019-05-23 DIAGNOSIS — R972 Elevated prostate specific antigen [PSA]: Secondary | ICD-10-CM

## 2019-06-17 ENCOUNTER — Other Ambulatory Visit: Payer: Self-pay

## 2019-06-17 ENCOUNTER — Ambulatory Visit
Admission: RE | Admit: 2019-06-17 | Discharge: 2019-06-17 | Disposition: A | Discharge status: Home / Self Care | Payer: PPO | Source: Ambulatory Visit | Attending: Urology | Admitting: Urology

## 2019-06-17 DIAGNOSIS — Z135 Encounter for screening for eye and ear disorders: Secondary | ICD-10-CM | POA: Diagnosis not present

## 2019-06-17 DIAGNOSIS — R972 Elevated prostate specific antigen [PSA]: Secondary | ICD-10-CM

## 2019-06-17 MED ORDER — GADOBENATE DIMEGLUMINE 529 MG/ML IV SOLN
18.0000 mL | Freq: Once | INTRAVENOUS | Status: AC | PRN
  Administered 2019-06-17: 18 mL via INTRAVENOUS

## 2019-08-24 DIAGNOSIS — N401 Enlarged prostate with lower urinary tract symptoms: Secondary | ICD-10-CM | POA: Diagnosis not present

## 2019-08-24 DIAGNOSIS — R972 Elevated prostate specific antigen [PSA]: Secondary | ICD-10-CM | POA: Diagnosis not present

## 2019-11-04 NOTE — Progress Notes (Signed)
Office Visit Note  Patient: Lance King             Date of Birth: 1950-09-19           MRN: [id number]             PCP: Maryella Shivers, MD Referring: Maryella Shivers, MD Visit Date: 11/17/2019 Occupation: @GUAROCC @  Subjective:  Joint stiffness.   History of Present Illness: Lance King is a 69 y.o. male with history of Paget's disease and osteoarthritis.  He states he is generally well after the Reclast infusion.  He has some stiffness off and on but no joint pain.  He states his neck has been stiff recently.  He has occasional discomfort in his knee joint.  He has been doing well.  He states he has been going to hematology for the follow-up regarding MGUS.  Activities of Daily Living:  Patient reports morning stiffness for 0 minute.   Patient Denies nocturnal pain.  Difficulty dressing/grooming: Denies Difficulty climbing stairs: Denies Difficulty getting out of chair: Denies Difficulty using hands for taps, buttons, cutlery, and/or writing: Denies  Review of Systems  Constitutional: Negative for fatigue and night sweats.  HENT: Negative for mouth sores, mouth dryness and nose dryness.   Eyes: Negative for redness and dryness.  Respiratory: Negative for shortness of breath and difficulty breathing.   Cardiovascular: Negative for chest pain, palpitations, hypertension, irregular heartbeat and swelling in legs/feet.  Gastrointestinal: Negative for constipation and diarrhea.  Endocrine: Negative for increased urination.  Musculoskeletal: Negative for arthralgias, joint pain, joint swelling, myalgias, muscle weakness, morning stiffness, muscle tenderness and myalgias.  Skin: Negative for color change, rash, hair loss, nodules/bumps, skin tightness, ulcers and sensitivity to sunlight.  Allergic/Immunologic: Negative for susceptible to infections.  Neurological: Negative for dizziness, fainting, memory loss, night sweats and weakness ( ).  Hematological: Negative for  swollen glands.  Psychiatric/Behavioral: Negative for depressed mood and sleep disturbance. The patient is not nervous/anxious.     PMFS History:  Patient Active Problem List   Diagnosis Date Noted  . Paget's disease of bone 04/20/2018  . MGUS (monoclonal gammopathy of unknown significance) 04/20/2018  . Vitamin D deficiency 04/20/2018  . DDD (degenerative disc disease), lumbar 04/06/2018  . Primary osteoarthritis of right knee 04/06/2018  . Adhesive capsulitis of right shoulder 04/06/2018  . Essential hypertension 04/06/2018    Past Medical History:  Diagnosis Date  . Complication of anesthesia    states a smaller tube has been used in the past due to sore throat  . Hypertension    EKG PCP 02/05/11 on chart    Family History  Problem Relation Age of Onset  . Hypertension Mother   . Hypertension Father    Past Surgical History:  Procedure Laterality Date  . BACK SURGERY     Lumbar decompression 1994  . KNEE ARTHROSCOPY     1987  . LUMBAR LAMINECTOMY/DECOMPRESSION MICRODISCECTOMY  03/05/2011   Procedure: LUMBAR LAMINECTOMY/DECOMPRESSION MICRODISCECTOMY;  Surgeon: Johnn Hai;  Location: [facility];  Service: Orthopedics;  Laterality: N/A;  Re do Decompression L3-L5  (X-Ray)    [facility]   Social History   Social History Narrative  . Not on file   There is no immunization history for the selected administration types on file for this patient.   Objective: Vital Signs: BP (!) 160/88   Pulse 61   Resp 12   Ht 5\' 8"  (1.727 m)   Wt 200 lb (90.7 kg)   BMI  30.41 kg/m    Physical Exam Vitals and nursing note reviewed.  Constitutional:      Appearance: He is well-developed.  HENT:     Head: Normocephalic and atraumatic.  Eyes:     Conjunctiva/sclera: Conjunctivae normal.     Pupils: Pupils are equal, round, and reactive to light.  Cardiovascular:     Rate and Rhythm: Normal rate and regular rhythm.     Heart sounds: Normal heart sounds.  Pulmonary:     Effort:  Pulmonary effort is normal.     Breath sounds: Normal breath sounds.  Abdominal:     General: Bowel sounds are normal.     Palpations: Abdomen is soft.  Musculoskeletal:     Cervical back: Normal range of motion and neck supple.  Skin:    General: Skin is warm and dry.     Capillary Refill: Capillary refill takes less than 2 seconds.  Neurological:     Mental Status: He is alert and oriented to person, place, and time.  Psychiatric:        Behavior: Behavior normal.      Musculoskeletal Exam: He had some limitation with left lateral rotation of his cervical spine.  Thoracic and lumbar spine were in good range of motion with no point tenderness.  Shoulder joints, elbow joints, wrist joints, MCPs PIPs and DIPs with good range of motion with no synovitis.  Hip joints, knee joints, ankles, MTPs and PIPs with good range of motion with no synovitis.  CDAI Exam: CDAI Score: -- Patient Global: --; Provider Global: -- Swollen: --; Tender: -- Joint Exam 11/17/2019   No joint exam has been documented for this visit   There is currently no information documented on the homunculus. Go to the Rheumatology activity and complete the homunculus joint exam.  Investigation: No additional findings.  Imaging: No results found.  Recent Labs: Lab Results  Component Value Date   WBC 6.0 12/07/2018   HGB 16.4 12/07/2018   PLT 248 12/07/2018   NA 139 12/07/2018   K 3.9 12/07/2018   CL 104 12/07/2018   CO2 30 12/07/2018   GLUCOSE 81 12/07/2018   BUN 11 12/07/2018   CREATININE 1.19 12/07/2018   BILITOT 1.1 12/07/2018   ALKPHOS 58 12/07/2018   AST 21 12/07/2018   ALT 21 12/07/2018   PROT 6.7 12/07/2018   ALBUMIN 4.4 12/07/2018   CALCIUM 9.4 12/07/2018   GFRAA >60 12/07/2018    Speciality Comments: No specialty comments available.  Procedures:  No procedures performed Allergies: Patient has no known allergies.   Assessment / Plan:     Visit Diagnoses: Paget's disease of bone - the  total body bone scan confirmed the diagnosis of Paget's diease localized to L1 on 04/19/18. Patient had Reclast infusion in March 2020.  He is generally well.  He denies any discomfort now.  Vitamin D deficiency -he has been taking vitamin D 2000 units daily.  We will check vitamin D level today.  Plan: VITAMIN D 25 Hydroxy (Vit-D Deficiency, Fractures)  Abnormal SPEP - he has been followed by heme-onc for MGUS.  He has appointment coming up with oncology.  Primary osteoarthritis of right knee-he is currently not having much discomfort.  Neck is stiffness-he has some discomfort with left lateral rotation.  He has no radiculopathy.  I have given him a handout on neck exercises.  DDD (degenerative disc disease), lumbar-he has some stiffness but no discomfort.  Essential hypertension-his blood pressure is elevated today.  Have  advised him to monitor blood pressure closely.  Medication monitoring encounter - Plan: COMPLETE METABOLIC PANEL WITH GFR  He had both vaccines for COVID-19.  Use of mask, social distancing and handwashing was emphasized. Orders: Orders Placed This Encounter  Procedures  . COMPLETE METABOLIC PANEL WITH GFR  . VITAMIN D 25 Hydroxy (Vit-D Deficiency, Fractures)   No orders of the defined types were placed in this encounter.     Follow-Up Instructions: Return in about 1 year (around 11/16/2020) for Paget's, OA.   Bo Merino, MD  Note - This record has been created using Editor, commissioning.  Chart creation errors have been sought, but may not always  have been located. Such creation errors do not reflect on  the standard of medical care.

## 2019-11-10 ENCOUNTER — Ambulatory Visit: Payer: PPO | Admitting: Rheumatology

## 2019-11-17 ENCOUNTER — Ambulatory Visit: Payer: PPO | Admitting: Rheumatology

## 2019-11-17 ENCOUNTER — Other Ambulatory Visit: Payer: Self-pay

## 2019-11-17 ENCOUNTER — Encounter: Payer: Self-pay | Admitting: Rheumatology

## 2019-11-17 VITALS — BP 160/88 | HR 61 | Resp 12 | Ht 68.0 in | Wt 200.0 lb

## 2019-11-17 DIAGNOSIS — M436 Torticollis: Secondary | ICD-10-CM

## 2019-11-17 DIAGNOSIS — E559 Vitamin D deficiency, unspecified: Secondary | ICD-10-CM

## 2019-11-17 DIAGNOSIS — I10 Essential (primary) hypertension: Secondary | ICD-10-CM | POA: Diagnosis not present

## 2019-11-17 DIAGNOSIS — R778 Other specified abnormalities of plasma proteins: Secondary | ICD-10-CM

## 2019-11-17 DIAGNOSIS — Z5181 Encounter for therapeutic drug level monitoring: Secondary | ICD-10-CM | POA: Diagnosis not present

## 2019-11-17 DIAGNOSIS — M1711 Unilateral primary osteoarthritis, right knee: Secondary | ICD-10-CM | POA: Diagnosis not present

## 2019-11-17 DIAGNOSIS — M5136 Other intervertebral disc degeneration, lumbar region: Secondary | ICD-10-CM

## 2019-11-17 DIAGNOSIS — M889 Osteitis deformans of unspecified bone: Secondary | ICD-10-CM | POA: Diagnosis not present

## 2019-11-17 DIAGNOSIS — M51369 Other intervertebral disc degeneration, lumbar region without mention of lumbar back pain or lower extremity pain: Secondary | ICD-10-CM

## 2019-11-17 LAB — COMPLETE METABOLIC PANEL WITH GFR
AG Ratio: 2.1 (calc) (ref 1.0–2.5)
ALT: 17 U/L (ref 9–46)
AST: 23 U/L (ref 10–35)
Albumin: 4 g/dL (ref 3.6–5.1)
Alkaline phosphatase (APISO): 50 U/L (ref 35–144)
BUN: 8 mg/dL (ref 7–25)
CO2: 29 mmol/L (ref 20–32)
Calcium: 8.9 mg/dL (ref 8.6–10.3)
Chloride: 105 mmol/L (ref 98–110)
Creat: 1 mg/dL (ref 0.70–1.18)
GFR, Est African American: 88 mL/min/{1.73_m2} (ref >=60)
GFR, Est Non African American: 76 mL/min/{1.73_m2} (ref >=60)
Globulin: 1.9 g/dL (calc) (ref 1.9–3.7)
Glucose, Bld: 79 mg/dL (ref 65–99)
Potassium: 4 mmol/L (ref 3.5–5.3)
Sodium: 139 mmol/L (ref 135–146)
Total Bilirubin: 1.4 mg/dL — ABNORMAL HIGH (ref 0.2–1.2)
Total Protein: 5.9 g/dL — ABNORMAL LOW (ref 6.1–8.1)

## 2019-11-17 LAB — VITAMIN D 25 HYDROXY (VIT D DEFICIENCY, FRACTURES): Vit D, 25-Hydroxy: 41 ng/mL (ref 30–100)

## 2019-11-17 NOTE — Patient Instructions (Signed)

## 2019-11-18 NOTE — Progress Notes (Signed)
Alk phos is normal.  Vitamin D is normal.  He should continue over-the-counter vitamin D supplement.

## 2019-12-06 ENCOUNTER — Inpatient Hospital Stay (HOSPITAL_BASED_OUTPATIENT_CLINIC_OR_DEPARTMENT_OTHER): Payer: PPO | Admitting: Family

## 2019-12-06 ENCOUNTER — Other Ambulatory Visit: Payer: Self-pay

## 2019-12-06 ENCOUNTER — Inpatient Hospital Stay: Payer: PPO | Attending: Hematology & Oncology

## 2019-12-06 ENCOUNTER — Encounter: Payer: Self-pay | Admitting: Family

## 2019-12-06 ENCOUNTER — Ambulatory Visit: Payer: PPO | Admitting: Hematology

## 2019-12-06 ENCOUNTER — Other Ambulatory Visit: Payer: PPO

## 2019-12-06 VITALS — BP 166/79 | HR 59 | Temp 98.3°F | Resp 18 | Ht 68.0 in | Wt 198.1 lb

## 2019-12-06 DIAGNOSIS — D472 Monoclonal gammopathy: Secondary | ICD-10-CM

## 2019-12-06 LAB — CMP (CANCER CENTER ONLY)
ALT: 19 U/L (ref 0–44)
AST: 22 U/L (ref 15–41)
Albumin: 4.3 g/dL (ref 3.5–5.0)
Alkaline Phosphatase: 52 U/L (ref 38–126)
Anion gap: 7 (ref 5–15)
BUN: 10 mg/dL (ref 8–23)
CO2: 28 mmol/L (ref 22–32)
Calcium: 9.6 mg/dL (ref 8.9–10.3)
Chloride: 104 mmol/L (ref 98–111)
Creatinine: 1.02 mg/dL (ref 0.61–1.24)
GFR, Est AFR Am: >60 mL/min (ref >60)
GFR, Estimated: >60 mL/min (ref >60)
Glucose, Bld: 122 mg/dL — ABNORMAL HIGH (ref 70–99)
Potassium: 4.1 mmol/L (ref 3.5–5.1)
Sodium: 139 mmol/L (ref 135–145)
Total Bilirubin: 1.4 mg/dL — ABNORMAL HIGH (ref 0.3–1.2)
Total Protein: 6.7 g/dL (ref 6.5–8.1)

## 2019-12-06 LAB — CBC WITH DIFFERENTIAL (CANCER CENTER ONLY)
Abs Immature Granulocytes: 0.02 10*3/uL (ref 0.00–0.07)
Basophils Absolute: 0 10*3/uL (ref 0.0–0.1)
Basophils Relative: 1 %
Eosinophils Absolute: 0 10*3/uL (ref 0.0–0.5)
Eosinophils Relative: 1 %
HCT: 50.3 % (ref 39.0–52.0)
Hemoglobin: 16.3 g/dL (ref 13.0–17.0)
Immature Granulocytes: 0 %
Lymphocytes Relative: 36 %
Lymphs Abs: 1.9 10*3/uL (ref 0.7–4.0)
MCH: 27.3 pg (ref 26.0–34.0)
MCHC: 32.4 g/dL (ref 30.0–36.0)
MCV: 84.3 fL (ref 80.0–100.0)
Monocytes Absolute: 0.7 10*3/uL (ref 0.1–1.0)
Monocytes Relative: 12 %
Neutro Abs: 2.8 10*3/uL (ref 1.7–7.7)
Neutrophils Relative %: 50 %
Platelet Count: 218 10*3/uL (ref 150–400)
RBC: 5.97 MIL/uL — ABNORMAL HIGH (ref 4.22–5.81)
RDW: 13.7 % (ref 11.5–15.5)
WBC Count: 5.5 10*3/uL (ref 4.0–10.5)
nRBC: 0 % (ref 0.0–0.2)

## 2019-12-06 NOTE — Progress Notes (Signed)
Hematology and Oncology Follow Up Visit  LUCILLE CRICHLOW [id number] September 06, 1950 69 y.o. 12/06/2019   Principle Diagnosis:  IgG lambda MGUS   Current Therapy:   Observation   Interim History:  Mr. Schnitker is here today for annual follow-up. He is doing quite well and has no complaints at this time.  He denies fatigued,  M-spike last year was 0.1. IgG level was 867 mg/dL and lambda light chains 76.1 mg/L. Today's results are pending.  No fever, chills, n/v, cough, rash, dizziness, SOB, chest pain, palpitations, abdominal pain/bloating or changes in bowel or bladder habits. No hot flashes or night sweats.  No episodes of bleeding. No bruising or petechiae.  No swelling, tenderness, numbness or tingling in her extremities.  No falls or syncope.  He has maintained a good appetite and is staying well hydrated. His weight is stable.   ECOG Performance Status: 0 - Asymptomatic  Medications:  Allergies as of 12/06/2019   No Known Allergies     Medication List       Accurate as of December 06, 2019  9:34 AM. If you have any questions, ask your nurse or doctor.        finasteride 5 MG tablet Commonly known as: PROSCAR finasteride 5 mg tablet   lisinopril 20 MG tablet Commonly known as: ZESTRIL lisinopril 20 mg tablet   tadalafil 5 MG tablet Commonly known as: CIALIS tadalafil 5 mg tablet  TAKE 1 TABLET BY MOUTH DAILY.   Vitamin D 50 MCG (2000 UT) Caps Take by mouth daily.       Allergies: No Known Allergies  Past Medical History, Surgical history, Social history, and Family History were reviewed and updated.  Review of Systems: All other 10 point review of systems is negative.   Physical Exam:  height is 5\' 8"  (1.727 m) and weight is 198 lb 1.3 oz (89.8 kg). His oral temperature is 98.3 F (36.8 C). His blood pressure is 166/79 (abnormal) and his pulse is 59 (abnormal). His respiration is 18 and oxygen saturation is 100%.   Wt Readings from Last 3 Encounters:    12/06/19 198 lb 1.3 oz (89.8 kg)  11/17/19 200 lb (90.7 kg)  12/07/18 192 lb 1.9 oz (87.1 kg)    Ocular: Sclerae unicteric, pupils equal, round and reactive to light Ear-nose-throat: Oropharynx clear, dentition fair Lymphatic: No cervical or supraclavicular adenopathy Lungs no rales or rhonchi, good excursion bilaterally Heart regular rate and rhythm, no murmur appreciated Abd soft, nontender, positive bowel sounds, no liver or spleen tip palpated on exam, no fluid wave  MSK no focal spinal tenderness, no joint edema Neuro: non-focal, well-oriented, appropriate affect Breasts: Deferred   Lab Results  Component Value Date   WBC 5.5 12/06/2019   HGB 16.3 12/06/2019   HCT 50.3 12/06/2019   MCV 84.3 12/06/2019   PLT 218 12/06/2019   No results found for: FERRITIN, IRON, TIBC, UIBC, IRONPCTSAT Lab Results  Component Value Date   RBC 5.97 (H) 12/06/2019   Lab Results  Component Value Date   KPAFRELGTCHN 11.7 12/07/2018   LAMBDASER 76.1 (H) 12/07/2018   KAPLAMBRATIO 0.15 (L) 12/07/2018   Lab Results  Component Value Date   IGGSERUM 867 12/07/2018   IGA 69 12/07/2018   IGMSERUM 17 (L) 12/07/2018   Lab Results  Component Value Date   TOTALPROTELP 6.5 12/07/2018   ALBUMINELP 4.2 04/06/2018   A1GS 0.3 04/06/2018   A2GS 0.7 04/06/2018   BETS 0.4 04/06/2018   BETA2SER 0.3 04/06/2018  GAMS 0.8 04/06/2018   SPEI  04/06/2018     Comment:     . Evaluation reveals a faint restricted band (M-spike)  migrating in the gamma globulin region.  Consider  immunofixation analysis if indicated. .      Chemistry      Component Value Date/Time   NA 139 11/17/2019 1333   K 4.0 11/17/2019 1333   CL 105 11/17/2019 1333   CO2 29 11/17/2019 1333   BUN 8 11/17/2019 1333   CREATININE 1.00 11/17/2019 1333      Component Value Date/Time   CALCIUM 8.9 11/17/2019 1333   ALKPHOS 58 12/07/2018 1013   AST 23 11/17/2019 1333   AST 21 12/07/2018 1013   ALT 17 11/17/2019 1333   ALT 21  12/07/2018 1013   BILITOT 1.4 (H) 11/17/2019 1333   BILITOT 1.1 12/07/2018 1013       Impression and Plan: Mr. Basquez is a very pleasant 69 yo African American gentleman with IgG lambda MGUS.  He continues to do well and has no complaints or symptoms at this time.  Myeloma panel is pending. If stable we will plan to see him again in another year.  He will contact our office with any questions or concerns. We can certainly see him sooner if needed.   Laverna Peace, NP 9/7/20219:34 AM

## 2019-12-07 LAB — KAPPA/LAMBDA LIGHT CHAINS
Kappa free light chain: 11.6 mg/L (ref 3.3–19.4)
Kappa, lambda light chain ratio: 0.17 — ABNORMAL LOW (ref 0.26–1.65)
Lambda free light chains: 69.6 mg/L — ABNORMAL HIGH (ref 5.7–26.3)

## 2019-12-08 LAB — MULTIPLE MYELOMA PANEL, SERUM
Albumin SerPl Elph-Mcnc: 4 g/dL (ref 2.9–4.4)
Albumin/Glob SerPl: 1.5 (ref 0.7–1.7)
Alpha 1: 0.2 g/dL (ref 0.0–0.4)
Alpha2 Glob SerPl Elph-Mcnc: 0.7 g/dL (ref 0.4–1.0)
B-Globulin SerPl Elph-Mcnc: 1 g/dL (ref 0.7–1.3)
Gamma Glob SerPl Elph-Mcnc: 0.9 g/dL (ref 0.4–1.8)
Globulin, Total: 2.7 g/dL (ref 2.2–3.9)
IgA: 70 mg/dL (ref 61–437)
IgG (Immunoglobin G), Serum: 864 mg/dL (ref 603–1613)
IgM (Immunoglobulin M), Srm: 17 mg/dL — ABNORMAL LOW (ref 20–172)
M Protein SerPl Elph-Mcnc: 0.2 g/dL — ABNORMAL HIGH
Total Protein ELP: 6.7 g/dL (ref 6.0–8.5)

## 2019-12-13 ENCOUNTER — Encounter: Payer: Self-pay | Admitting: Family

## 2019-12-28 DIAGNOSIS — N401 Enlarged prostate with lower urinary tract symptoms: Secondary | ICD-10-CM | POA: Diagnosis not present

## 2019-12-28 DIAGNOSIS — Z8042 Family history of malignant neoplasm of prostate: Secondary | ICD-10-CM | POA: Diagnosis not present

## 2019-12-28 DIAGNOSIS — R972 Elevated prostate specific antigen [PSA]: Secondary | ICD-10-CM | POA: Diagnosis not present

## 2020-01-18 DIAGNOSIS — Z139 Encounter for screening, unspecified: Secondary | ICD-10-CM | POA: Diagnosis not present

## 2020-01-18 DIAGNOSIS — Z1339 Encounter for screening examination for other mental health and behavioral disorders: Secondary | ICD-10-CM | POA: Diagnosis not present

## 2020-01-18 DIAGNOSIS — Z1331 Encounter for screening for depression: Secondary | ICD-10-CM | POA: Diagnosis not present

## 2020-01-18 DIAGNOSIS — Z0000 Encounter for general adult medical examination without abnormal findings: Secondary | ICD-10-CM | POA: Diagnosis not present

## 2020-01-18 DIAGNOSIS — Z23 Encounter for immunization: Secondary | ICD-10-CM | POA: Diagnosis not present

## 2020-01-18 DIAGNOSIS — I10 Essential (primary) hypertension: Secondary | ICD-10-CM | POA: Diagnosis not present

## 2020-01-18 DIAGNOSIS — Z1389 Encounter for screening for other disorder: Secondary | ICD-10-CM | POA: Diagnosis not present

## 2020-01-26 IMAGING — PT NM PET TUM IMG INITIAL (PI) WHOLE BODY
1 of 8 series · 3 of 25 positions shown · non-contrast
Comparison: Bone scan dated 04/19/2018 and MRI dated 01/11/2010

CLINICAL DATA: Initial treatment strategy for monoclonal
gammopathy.

EXAM:
NUCLEAR MEDICINE PET WHOLE BODY
TECHNIQUE: 10.0 mCi F-18 FDG was injected intravenously. Full-ring PET imaging
was performed from the skull base to thigh after the radiotracer. CT
data was obtained and used for attenuation correction and anatomic
localization.
Fasting blood glucose: 82 mg/dl

[Series 4: ct wb 5.0 hd_fov · axial · 5.0mm · 1.27mm/px · z∈[-225,+723]mm · 3 of 432 slices shown]
[im 108/432  soft-tissue]
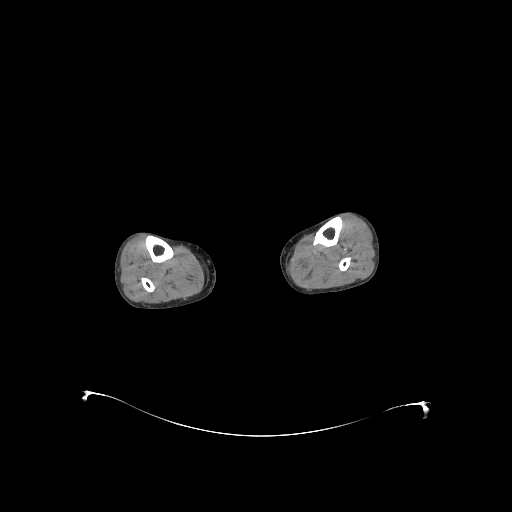
[im 216/432  soft-tissue]
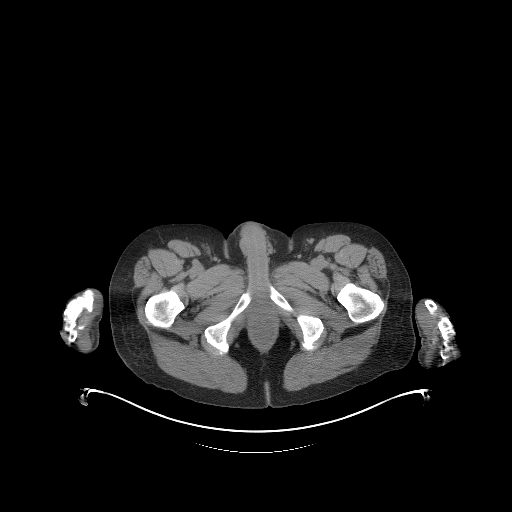
[im 324/432  soft-tissue]
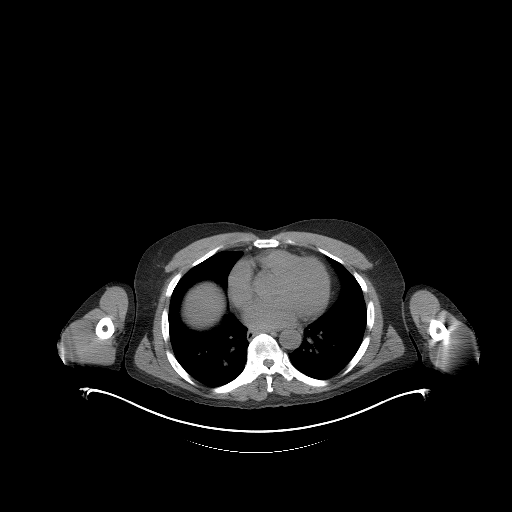

[3 of 25 positions shown; findings below may reference images not displayed]

FINDINGS: Mediastinal blood pool activity: SUV max

HEAD/NECK: No significant abnormal hypermetabolic activity in this
region.

Incidental CT findings: none

CHEST: No significant abnormal hypermetabolic activity in this
region.

Incidental CT findings: Atherosclerotic calcification of the aortic
arch.

ABDOMEN/PELVIS: Prostatomegaly with accentuated activity along the
left posterior prostate apex, maximum SUV 6.3.

Incidental CT findings: Cholelithiasis. Possible 8 mm rim calcified
right renal artery aneurysm on image 142/4. Sigmoid diverticulosis.

SKELETON: Very subtle accentuated activity eccentric to the left in
the L1 vertebral body which demonstrates typical pagetoid features,
as been has been shown on multiple prior exams including the lumbar
MRI from 02/11/2010.

An oval-shaped lucency the base of the right acromion measures
by 0.7 cm on image 67/4 and does not have definite appreciable
accentuated metabolic activity.

Incidental CT findings: Lower lumbar laminectomies. Bridging
spurring of the sacroiliac joints. Degenerative spurring of both
hips.

EXTREMITIES: No significant abnormal hypermetabolic activity in this
region.

Incidental CT findings: No supplemental non-categorized findings.
IMPRESSION: 1. No hypermetabolic lesions suspicious for active myeloma
identified. There is a small lucent lesion in the right acromion
which has no appreciable accentuated activity in which is
technically nonspecific. Also, pagetoid findings in the L1 vertebra
are chronically stable.
2. Prostatomegaly with accentuated activity along the left
posterolateral prostate apex peripheral zone. Correlate with PSA
level and patient history in determining whether further workup is
warranted.
3. Other imaging findings of potential clinical significance: Aortic
Atherosclerosis ([id number]). Cholelithiasis. Degenerative spurring
of the hips. Possible 8 mm rim calcified right renal artery
aneurysm. Sigmoid diverticulosis.

## 2020-02-08 DIAGNOSIS — I10 Essential (primary) hypertension: Secondary | ICD-10-CM | POA: Diagnosis not present

## 2020-02-08 DIAGNOSIS — Z6830 Body mass index (BMI) 30.0-30.9, adult: Secondary | ICD-10-CM | POA: Diagnosis not present

## 2020-02-08 DIAGNOSIS — M889 Osteitis deformans of unspecified bone: Secondary | ICD-10-CM | POA: Diagnosis not present

## 2020-02-08 DIAGNOSIS — N400 Enlarged prostate without lower urinary tract symptoms: Secondary | ICD-10-CM | POA: Diagnosis not present

## 2020-06-26 DIAGNOSIS — N529 Male erectile dysfunction, unspecified: Secondary | ICD-10-CM | POA: Diagnosis not present

## 2020-06-26 DIAGNOSIS — R972 Elevated prostate specific antigen [PSA]: Secondary | ICD-10-CM | POA: Diagnosis not present

## 2020-06-26 DIAGNOSIS — N401 Enlarged prostate with lower urinary tract symptoms: Secondary | ICD-10-CM | POA: Diagnosis not present

## 2020-06-26 DIAGNOSIS — Z8042 Family history of malignant neoplasm of prostate: Secondary | ICD-10-CM | POA: Diagnosis not present

## 2020-09-24 DIAGNOSIS — Z6828 Body mass index (BMI) 28.0-28.9, adult: Secondary | ICD-10-CM | POA: Diagnosis not present

## 2020-09-24 DIAGNOSIS — I10 Essential (primary) hypertension: Secondary | ICD-10-CM | POA: Diagnosis not present

## 2020-09-24 DIAGNOSIS — N400 Enlarged prostate without lower urinary tract symptoms: Secondary | ICD-10-CM | POA: Diagnosis not present

## 2020-09-24 DIAGNOSIS — M889 Osteitis deformans of unspecified bone: Secondary | ICD-10-CM | POA: Diagnosis not present

## 2020-10-31 NOTE — Progress Notes (Signed)
Office Visit Note  Patient: Lance King             Date of Birth: [birth year]           MRN: [id number]             PCP: Maryella Shivers, MD Referring: Maryella Shivers, MD Visit Date: 11/14/2020 Occupation: '@GUAROCC'$ @  Subjective:  Follow-up on Paget's disease   History of Present Illness: Lance King is a 70 y.o. male with a history of Paget's disease of lumbar spine.  He had Reclast infusion in March 2020.  He states he has been doing well.  He denies any discomfort.  He denies any discomfort in his right knee currently.  He has been seeing hematology once a year per patient.  Activities of Daily Living:  Patient reports morning stiffness for 0  none .   Patient Denies nocturnal pain.  Difficulty dressing/grooming: Denies Difficulty climbing stairs: Denies Difficulty getting out of chair: Denies Difficulty using hands for taps, buttons, cutlery, and/or writing: Denies  Review of Systems  Constitutional:  Negative for fatigue.  HENT:  Negative for mouth dryness.   Eyes:  Negative for dryness.  Respiratory:  Negative for shortness of breath.   Cardiovascular:  Negative for swelling in legs/feet.  Gastrointestinal:  Negative for constipation.  Endocrine: Negative for excessive thirst.  Genitourinary:  Negative for difficulty urinating.  Musculoskeletal:  Negative for joint pain and joint pain.  Skin:  Negative for rash.  Allergic/Immunologic: Negative for susceptible to infections.  Neurological:  Negative for weakness.  Hematological:  Negative for bruising/bleeding tendency.  Psychiatric/Behavioral:  Negative for sleep disturbance.    PMFS History:  Patient Active Problem List   Diagnosis Date Noted   Paget's disease of bone 04/20/2018   MGUS (monoclonal gammopathy of unknown significance) 04/20/2018   Vitamin D deficiency 04/20/2018   DDD (degenerative disc disease), lumbar 04/06/2018   Primary osteoarthritis of right knee 04/06/2018   Adhesive capsulitis  of right shoulder 04/06/2018   Essential hypertension 04/06/2018    Past Medical History:  Diagnosis Date   Complication of anesthesia    states a smaller tube has been used in the past due to sore throat   Hypertension    EKG PCP 02/05/11 on chart    Family History  Problem Relation Age of Onset   Hypertension Mother    Hypertension Father    Past Surgical History:  Procedure Laterality Date   BACK SURGERY     Lumbar decompression 1994   KNEE ARTHROSCOPY     1987   LUMBAR LAMINECTOMY/DECOMPRESSION MICRODISCECTOMY  03/05/2011   Procedure: LUMBAR LAMINECTOMY/DECOMPRESSION MICRODISCECTOMY;  Surgeon: Johnn Hai;  Location: [facility];  Service: Orthopedics;  Laterality: N/A;  Re do Decompression L3-L5  (X-Ray)    Central   Social History   Social History Narrative   Not on file   Immunization History  Administered Date(s) Administered   PFIZER(Purple Top)SARS-COV-2 Vaccination 04/17/2019, 05/07/2019, 01/19/2020, 08/07/2020   Zoster Recombinat (Shingrix) 01/28/2018     Objective: Vital Signs: BP 137/69 (BP Location: Left Arm, Patient Position: Sitting, Cuff Size: Small)   Pulse 66   Resp 12   Ht '5\' 8"'$  (1.727 m)   Wt 188 lb 6.4 oz (85.5 kg)   BMI 28.65 kg/m    Physical Exam Vitals and nursing note reviewed.  Constitutional:      Appearance: He is well-developed.  HENT:     Head: Normocephalic and atraumatic.  Eyes:  Conjunctiva/sclera: Conjunctivae normal.     Pupils: Pupils are equal, round, and reactive to light.  Cardiovascular:     Rate and Rhythm: Normal rate and regular rhythm.     Heart sounds: Normal heart sounds.  Pulmonary:     Effort: Pulmonary effort is normal.     Breath sounds: Normal breath sounds.  Abdominal:     General: Bowel sounds are normal.     Palpations: Abdomen is soft.  Musculoskeletal:     Cervical back: Normal range of motion and neck supple.  Skin:    General: Skin is warm and dry.     Capillary Refill: Capillary refill  takes less than 2 seconds.  Neurological:     Mental Status: He is alert and oriented to person, place, and time.  Psychiatric:        Behavior: Behavior normal.     Musculoskeletal Exam: C-spine was in good range of motion.  He had no discomfort on palpation of the lumbar spine.  He had good flexibility in his lumbar spine.  Shoulder joints, elbow joints, wrist joints, MCPs PIPs and DIPs with good range of motion with no synovitis.  Hip joints, knee joints, ankles, MTPs and PIPs with good range of motion with no synovitis.  CDAI Exam: CDAI Score: -- Patient Global: --; Provider Global: -- Swollen: --; Tender: -- Joint Exam 11/14/2020   No joint exam has been documented for this visit   There is currently no information documented on the homunculus. Go to the Rheumatology activity and complete the homunculus joint exam.  Investigation: No additional findings.  Imaging: No results found.  Recent Labs: Lab Results  Component Value Date   WBC 5.5 12/06/2019   HGB 16.3 12/06/2019   PLT 218 12/06/2019   NA 139 12/06/2019   K 4.1 12/06/2019   CL 104 12/06/2019   CO2 28 12/06/2019   GLUCOSE 122 (H) 12/06/2019   BUN 10 12/06/2019   CREATININE 1.02 12/06/2019   BILITOT 1.4 (H) 12/06/2019   ALKPHOS 52 12/06/2019   AST 22 12/06/2019   ALT 19 12/06/2019   PROT 6.7 12/06/2019   ALBUMIN 4.3 12/06/2019   CALCIUM 9.6 12/06/2019   GFRAA >60 12/06/2019    Speciality Comments: No specialty comments available.  Procedures:  No procedures performed Allergies: Patient has no known allergies.   Assessment / Plan:     Visit Diagnoses: Paget's disease of bone - the total body bone scan confirmed the diagnosis of Paget's diease localized to L1 on 04/19/18. Patient had Reclast infusion in March 2020.  -He has no recurrence of discomfort.  His labs have been stable.  We will check CMP today to look for alkaline phosphatase.  Plan: COMPLETE METABOLIC PANEL WITH GFR  Vitamin D deficiency  -according to the patient he is taking vitamin D 2000 units daily.  We will check vitamin D level today.  Plan: VITAMIN D 25 Hydroxy (Vit-D Deficiency, Fractures)  Abnormal SPEP - he has been followed by heme-onc for MGUS.  Primary osteoarthritis of right knee-he denies any discomfort.  DDD (degenerative disc disease), lumbar-he had good flexibility and no point tenderness.  Essential hypertension-blood pressure is normal.  Orders: Orders Placed This Encounter  Procedures   COMPLETE METABOLIC PANEL WITH GFR   VITAMIN D 25 Hydroxy (Vit-D Deficiency, Fractures)    No orders of the defined types were placed in this encounter.    Follow-Up Instructions: Return in about 1 year (around 11/14/2021) for paget's.   Abel Presto  Estanislado Pandy, MD  Note - This record has been created using Editor, commissioning.  Chart creation errors have been sought, but may not always  have been located. Such creation errors do not reflect on  the standard of medical care.

## 2020-11-14 ENCOUNTER — Other Ambulatory Visit: Payer: Self-pay

## 2020-11-14 ENCOUNTER — Ambulatory Visit: Payer: PPO | Admitting: Rheumatology

## 2020-11-14 ENCOUNTER — Encounter: Payer: Self-pay | Admitting: Rheumatology

## 2020-11-14 VITALS — BP 137/69 | HR 66 | Resp 12 | Ht 68.0 in | Wt 188.4 lb

## 2020-11-14 DIAGNOSIS — Z5181 Encounter for therapeutic drug level monitoring: Secondary | ICD-10-CM

## 2020-11-14 DIAGNOSIS — I10 Essential (primary) hypertension: Secondary | ICD-10-CM | POA: Diagnosis not present

## 2020-11-14 DIAGNOSIS — E559 Vitamin D deficiency, unspecified: Secondary | ICD-10-CM | POA: Diagnosis not present

## 2020-11-14 DIAGNOSIS — M5136 Other intervertebral disc degeneration, lumbar region: Secondary | ICD-10-CM

## 2020-11-14 DIAGNOSIS — M1711 Unilateral primary osteoarthritis, right knee: Secondary | ICD-10-CM | POA: Diagnosis not present

## 2020-11-14 DIAGNOSIS — M889 Osteitis deformans of unspecified bone: Secondary | ICD-10-CM | POA: Diagnosis not present

## 2020-11-14 DIAGNOSIS — M436 Torticollis: Secondary | ICD-10-CM

## 2020-11-14 DIAGNOSIS — R778 Other specified abnormalities of plasma proteins: Secondary | ICD-10-CM | POA: Diagnosis not present

## 2020-11-14 NOTE — Patient Instructions (Signed)
Paget's Disease of Bone Paget's disease is a condition that makes the bones grow faster than normal. Healthy bones rebuild themselves by breaking down old bone and replacing it with new bone on a regular basis. This process is called bone turnover or remodeling and normally slows down with age. In Paget's disease, new bone is formed faster than the old bone can be removed. This may cause bones to: Be larger and weaker than normal. Have abnormal shapes (deformities). Break more easily than healthy bones. Paget's disease may affect just a few bones, or it may affect bones all overthe body. Bones in the arms, legs, spine, pelvis, and skull are often affected. What are the causes? The cause of this condition is not known. What increases the risk? The following factors may make you more likely to develop this condition: Having a family history of the disease. Being 30 years of age or older. Being male. Being of European descent. What are the signs or symptoms? Symptoms of this condition may include: Bone pain, neck pain, and headache. Joint pain or stiffness. Tingling or numbness. Legs that bend outward from the hips to the ankles (bowed legs). Bones that break easily. A feeling of warmth in areas of skin that are over the affected bone. Loss of height. You may also have changes in the shape of the skull or other bones. Hearingloss may occur if the skull bones are affected. In some cases, there are no symptoms of this condition. How is this diagnosed? This condition may be diagnosed based on: A physical exam. Your medical history. Blood and urine tests. Imaging tests, such as X-rays and bone scans. Removal and testing of a bone sample (bone biopsy). This is rarely needed. How is this treated? Treatment for this condition depends on your symptoms and which areas of your body are affected. If you have no symptoms, you may not need treatment. However, you may need treatment if the condition  is causing symptoms, affecting your skull, or putting you at risk for broken bones (fractures). The goals of treatment are to relieve bone pain and to prevent the condition from getting worse. Treatment may include: Medicines for pain. This may include NSAIDs, such as ibuprofen. Medicines that slow down abnormal bone turnover (bisphosphonates or calcitonin). Calcium and vitamin D supplements. Devices to help you move around. These are called assistive devices or mobility aids. These may include a cane, walker, or brace. Surgery to treat problems that are caused by the disease. Surgery is rarely used. Follow these instructions at home: Medicines Take over-the-counter and prescription medicines only as told by your health care provider. Take calcium and vitamin D supplements as told by your health care provider. If you are taking a bisphosphonate by mouth: Take the medicine with a large glass of water. Take it in the morning. Do not eat or drink anything for 30 minutes after taking the medicine. Stay upright for 30 minutes after taking the medicine. Avoid lying down during this time. Preventing falls and fractures  This condition may put you at greater risk for falls and fractures. To help prevent falls: Use a cane or walker as needed. Wear closed-toe shoes that fit well and support your feet. Wear shoes that have rubber soles or low heels. Remove clutter and tripping hazards from all walkways. Use good lighting in all rooms. Use a night-light to help you see during the night. Install and use handrails on stairways and in the bathroom. Have your eyes checked regularly. If you  wear glasses, wear them at all times. Ask your health care provider what activities are safe for you. You may need to avoid activities that put you at risk for falls or injuries, such as contact sports.  General instructions  Wear braces as told by your health care provider. You may need to have regular blood tests to  monitor your treatment. Keep all follow-up visits. This is important.  Contact a health care provider if: You have bone pain or joint pain that gets worse. You have hearing loss. You have swelling in your legs or ankles. You have abdominal pain. You lose your appetite. You are not able to have a bowel movement. You have fatigue or weakness. Get help right away if: You think that you might have a broken bone. You fall. You have chest pain. You have shortness of breath. You have numbness or are not able to move your arms or legs. Summary Paget's disease is a condition that makes the bones grow faster than normal. This causes bones to be larger and weaker than normal, have abnormal shapes (deformities), and break more easily than healthy bones. This condition is more likely to develop in people who have a family history of the disease, are 70 or older, are male, or are of European descent. The goals of treatment are to relieve bone pain and to prevent the condition from getting worse. Treatment may include pain medicine, medicines that slow down abnormal bone turnover (bisphosphonates or calcitonin), and in rare cases, surgery. Keep all follow-up visits. This is important. You may need to have regular blood tests to monitor your treatment. This information is not intended to replace advice given to you by your health care provider. Make sure you discuss any questions you have with your healthcare provider. Document Revised: 09/01/2019 Document Reviewed: 09/01/2019 Elsevier Patient Education  [place].

## 2020-11-15 LAB — COMPLETE METABOLIC PANEL WITH GFR
AG Ratio: 2.2 (calc) (ref 1.0–2.5)
ALT: 22 U/L (ref 9–46)
AST: 22 U/L (ref 10–35)
Albumin: 4.3 g/dL (ref 3.6–5.1)
Alkaline phosphatase (APISO): 50 U/L (ref 35–144)
BUN: 11 mg/dL (ref 7–25)
CO2: 30 mmol/L (ref 20–32)
Calcium: 9.3 mg/dL (ref 8.6–10.3)
Chloride: 101 mmol/L (ref 98–110)
Creat: 1.04 mg/dL (ref 0.70–1.28)
Globulin: 2 g/dL (calc) (ref 1.9–3.7)
Glucose, Bld: 148 mg/dL — ABNORMAL HIGH (ref 65–99)
Potassium: 3.8 mmol/L (ref 3.5–5.3)
Sodium: 139 mmol/L (ref 135–146)
Total Bilirubin: 1 mg/dL (ref 0.2–1.2)
Total Protein: 6.3 g/dL (ref 6.1–8.1)
eGFR: 77 mL/min/{1.73_m2} (ref >=60)

## 2020-11-15 LAB — VITAMIN D 25 HYDROXY (VIT D DEFICIENCY, FRACTURES): Vit D, 25-Hydroxy: 52 ng/mL (ref 30–100)

## 2020-11-15 NOTE — Progress Notes (Signed)
Vitamin D is normal, alkaline  phosphatase is normal, glucose is elevated.  Please discuss results with the patient and also forward results to his PCP.

## 2020-12-27 DIAGNOSIS — N529 Male erectile dysfunction, unspecified: Secondary | ICD-10-CM | POA: Diagnosis not present

## 2020-12-27 DIAGNOSIS — N401 Enlarged prostate with lower urinary tract symptoms: Secondary | ICD-10-CM | POA: Diagnosis not present

## 2020-12-27 DIAGNOSIS — R972 Elevated prostate specific antigen [PSA]: Secondary | ICD-10-CM | POA: Diagnosis not present

## 2020-12-27 DIAGNOSIS — Z8042 Family history of malignant neoplasm of prostate: Secondary | ICD-10-CM | POA: Diagnosis not present

## 2021-01-04 ENCOUNTER — Other Ambulatory Visit: Payer: Self-pay | Admitting: Urology

## 2021-01-04 DIAGNOSIS — Z23 Encounter for immunization: Secondary | ICD-10-CM | POA: Diagnosis not present

## 2021-01-04 DIAGNOSIS — R972 Elevated prostate specific antigen [PSA]: Secondary | ICD-10-CM

## 2021-01-21 DIAGNOSIS — Z136 Encounter for screening for cardiovascular disorders: Secondary | ICD-10-CM | POA: Diagnosis not present

## 2021-01-21 DIAGNOSIS — Z139 Encounter for screening, unspecified: Secondary | ICD-10-CM | POA: Diagnosis not present

## 2021-01-21 DIAGNOSIS — Z0000 Encounter for general adult medical examination without abnormal findings: Secondary | ICD-10-CM | POA: Diagnosis not present

## 2021-01-21 DIAGNOSIS — Z1339 Encounter for screening examination for other mental health and behavioral disorders: Secondary | ICD-10-CM | POA: Diagnosis not present

## 2021-01-21 DIAGNOSIS — Z1331 Encounter for screening for depression: Secondary | ICD-10-CM | POA: Diagnosis not present

## 2021-01-25 ENCOUNTER — Ambulatory Visit
Admission: RE | Admit: 2021-01-25 | Discharge: 2021-01-25 | Disposition: A | Discharge status: Home / Self Care | Payer: PPO | Source: Ambulatory Visit | Attending: Urology | Admitting: Urology

## 2021-01-25 ENCOUNTER — Other Ambulatory Visit: Payer: Self-pay

## 2021-01-25 DIAGNOSIS — R972 Elevated prostate specific antigen [PSA]: Secondary | ICD-10-CM

## 2021-01-25 MED ORDER — GADOBENATE DIMEGLUMINE 529 MG/ML IV SOLN
18.0000 mL | Freq: Once | INTRAVENOUS | Status: AC | PRN
  Administered 2021-01-25: 18 mL via INTRAVENOUS

## 2021-03-07 DIAGNOSIS — J029 Acute pharyngitis, unspecified: Secondary | ICD-10-CM | POA: Diagnosis not present

## 2021-03-07 DIAGNOSIS — J020 Streptococcal pharyngitis: Secondary | ICD-10-CM | POA: Diagnosis not present

## 2021-03-07 DIAGNOSIS — Z20822 Contact with and (suspected) exposure to covid-19: Secondary | ICD-10-CM | POA: Diagnosis not present

## 2021-03-07 DIAGNOSIS — U071 COVID-19: Secondary | ICD-10-CM | POA: Diagnosis not present

## 2021-03-11 DIAGNOSIS — R918 Other nonspecific abnormal finding of lung field: Secondary | ICD-10-CM | POA: Diagnosis not present

## 2021-03-11 DIAGNOSIS — U071 COVID-19: Secondary | ICD-10-CM | POA: Diagnosis not present

## 2021-03-14 DIAGNOSIS — Z7901 Long term (current) use of anticoagulants: Secondary | ICD-10-CM | POA: Diagnosis not present

## 2021-03-14 DIAGNOSIS — N400 Enlarged prostate without lower urinary tract symptoms: Secondary | ICD-10-CM | POA: Diagnosis not present

## 2021-03-14 DIAGNOSIS — J156 Pneumonia due to other aerobic Gram-negative bacteria: Secondary | ICD-10-CM | POA: Diagnosis not present

## 2021-03-14 DIAGNOSIS — I10 Essential (primary) hypertension: Secondary | ICD-10-CM | POA: Diagnosis not present

## 2021-03-14 DIAGNOSIS — I361 Nonrheumatic tricuspid (valve) insufficiency: Secondary | ICD-10-CM | POA: Diagnosis not present

## 2021-03-14 DIAGNOSIS — I2699 Other pulmonary embolism without acute cor pulmonale: Secondary | ICD-10-CM | POA: Diagnosis not present

## 2021-03-14 DIAGNOSIS — I340 Nonrheumatic mitral (valve) insufficiency: Secondary | ICD-10-CM | POA: Diagnosis not present

## 2021-03-14 DIAGNOSIS — R079 Chest pain, unspecified: Secondary | ICD-10-CM | POA: Diagnosis not present

## 2021-03-14 DIAGNOSIS — J9811 Atelectasis: Secondary | ICD-10-CM | POA: Diagnosis not present

## 2021-03-14 DIAGNOSIS — J90 Pleural effusion, not elsewhere classified: Secondary | ICD-10-CM | POA: Diagnosis not present

## 2021-03-14 DIAGNOSIS — U071 COVID-19: Secondary | ICD-10-CM | POA: Diagnosis not present

## 2021-03-14 DIAGNOSIS — Z79899 Other long term (current) drug therapy: Secondary | ICD-10-CM | POA: Diagnosis not present

## 2021-03-14 DIAGNOSIS — R0789 Other chest pain: Secondary | ICD-10-CM | POA: Diagnosis not present

## 2021-03-14 DIAGNOSIS — R059 Cough, unspecified: Secondary | ICD-10-CM | POA: Diagnosis not present

## 2021-03-14 DIAGNOSIS — Z792 Long term (current) use of antibiotics: Secondary | ICD-10-CM | POA: Diagnosis not present

## 2021-03-14 DIAGNOSIS — R7989 Other specified abnormal findings of blood chemistry: Secondary | ICD-10-CM | POA: Diagnosis not present

## 2021-03-15 DIAGNOSIS — R7989 Other specified abnormal findings of blood chemistry: Secondary | ICD-10-CM | POA: Diagnosis not present

## 2021-03-15 DIAGNOSIS — I2699 Other pulmonary embolism without acute cor pulmonale: Secondary | ICD-10-CM | POA: Diagnosis not present

## 2021-03-18 DIAGNOSIS — I2699 Other pulmonary embolism without acute cor pulmonale: Secondary | ICD-10-CM | POA: Diagnosis not present

## 2021-03-18 DIAGNOSIS — Z7689 Persons encountering health services in other specified circumstances: Secondary | ICD-10-CM | POA: Diagnosis not present

## 2021-03-18 DIAGNOSIS — U071 COVID-19: Secondary | ICD-10-CM | POA: Diagnosis not present

## 2021-03-18 DIAGNOSIS — R093 Abnormal sputum: Secondary | ICD-10-CM | POA: Diagnosis not present

## 2021-05-28 DIAGNOSIS — Z131 Encounter for screening for diabetes mellitus: Secondary | ICD-10-CM | POA: Diagnosis not present

## 2021-05-28 DIAGNOSIS — Z13220 Encounter for screening for lipoid disorders: Secondary | ICD-10-CM | POA: Diagnosis not present

## 2021-05-28 DIAGNOSIS — I10 Essential (primary) hypertension: Secondary | ICD-10-CM | POA: Diagnosis not present

## 2021-06-05 DIAGNOSIS — Z6828 Body mass index (BMI) 28.0-28.9, adult: Secondary | ICD-10-CM | POA: Diagnosis not present

## 2021-06-05 DIAGNOSIS — I10 Essential (primary) hypertension: Secondary | ICD-10-CM | POA: Diagnosis not present

## 2021-06-05 DIAGNOSIS — R7303 Prediabetes: Secondary | ICD-10-CM | POA: Diagnosis not present

## 2021-06-05 DIAGNOSIS — N400 Enlarged prostate without lower urinary tract symptoms: Secondary | ICD-10-CM | POA: Diagnosis not present

## 2021-07-01 DIAGNOSIS — Z8042 Family history of malignant neoplasm of prostate: Secondary | ICD-10-CM | POA: Diagnosis not present

## 2021-07-01 DIAGNOSIS — N529 Male erectile dysfunction, unspecified: Secondary | ICD-10-CM | POA: Diagnosis not present

## 2021-07-01 DIAGNOSIS — R972 Elevated prostate specific antigen [PSA]: Secondary | ICD-10-CM | POA: Diagnosis not present

## 2021-07-01 DIAGNOSIS — N401 Enlarged prostate with lower urinary tract symptoms: Secondary | ICD-10-CM | POA: Diagnosis not present

## 2021-10-30 NOTE — Progress Notes (Signed)
Office Visit Note  Patient: Lance King             Date of Birth: [birth year]           MRN: [id number]             PCP: Maryella Shivers, MD Referring: Maryella Shivers, MD Visit Date: 11/13/2021 Occupation: '@GUAROCC'$ @  Subjective:  Joint stiffness  History of Present Illness: Lance EGNOR is a 71 y.o. male with history of Paget's disease and osteoarthritis.  He states he continues to have stiffness in his hands and his knee joints.  He has off-and-on discomfort in his lower back related to the activities.  He has not noticed any joint swelling.  He denies any joint pain.  Activities of Daily Living:  Patient reports morning stiffness for all day. Patient Denies nocturnal pain.  Difficulty dressing/grooming: Denies Difficulty climbing stairs: Denies Difficulty getting out of chair: Denies Difficulty using hands for taps, buttons, cutlery, and/or writing: Denies  Review of Systems  Constitutional:  Negative for fatigue.  HENT:  Negative for mouth sores and mouth dryness.   Eyes:  Negative for dryness.  Respiratory:  Negative for shortness of breath.   Cardiovascular:  Negative for chest pain and palpitations.  Gastrointestinal:  Negative for blood in stool, constipation and diarrhea.  Endocrine: Negative for increased urination.  Genitourinary:  Negative for involuntary urination.  Musculoskeletal:  Positive for morning stiffness. Negative for joint pain, gait problem, joint pain, joint swelling, myalgias, muscle weakness, muscle tenderness and myalgias.  Skin:  Negative for color change, rash and sensitivity to sunlight.  Allergic/Immunologic: Negative for susceptible to infections.  Neurological:  Negative for dizziness and headaches.  Hematological:  Negative for swollen glands.  Psychiatric/Behavioral:  Negative for depressed mood and sleep disturbance. The patient is not nervous/anxious.     PMFS History:  Patient Active Problem List   Diagnosis Date Noted    Paget's disease of bone 04/20/2018   MGUS (monoclonal gammopathy of unknown significance) 04/20/2018   Vitamin D deficiency 04/20/2018   DDD (degenerative disc disease), lumbar 04/06/2018   Primary osteoarthritis of right knee 04/06/2018   Adhesive capsulitis of right shoulder 04/06/2018   Essential hypertension 04/06/2018    Past Medical History:  Diagnosis Date   Complication of anesthesia    states a smaller tube has been used in the past due to sore throat   Hypertension    EKG PCP 02/05/11 on chart    Family History  Problem Relation Age of Onset   Hypertension Mother    Hypertension Father    Past Surgical History:  Procedure Laterality Date   BACK SURGERY     Lumbar decompression 1994   KNEE ARTHROSCOPY     1987   LUMBAR LAMINECTOMY/DECOMPRESSION MICRODISCECTOMY  03/05/2011   Procedure: LUMBAR LAMINECTOMY/DECOMPRESSION MICRODISCECTOMY;  Surgeon: Johnn Hai;  Location: [facility];  Service: Orthopedics;  Laterality: N/A;  Re do Decompression L3-L5  (X-Ray)    Central   Social History   Social History Narrative   Not on file   Immunization History  Administered Date(s) Administered   PFIZER(Purple Top)SARS-COV-2 Vaccination 04/17/2019, 05/07/2019, 01/19/2020, 08/07/2020   Zoster Recombinat (Shingrix) 01/28/2018     Objective: Vital Signs: BP 125/82 (BP Location: Left Arm, Patient Position: Sitting, Cuff Size: Normal)   Pulse 61   Resp 16   Ht '5\' 8"'$  (1.727 m)   Wt 186 lb 9.6 oz (84.6 kg)   BMI 28.37 kg/m    Physical  Exam Vitals and nursing note reviewed.  Constitutional:      Appearance: He is well-developed.  HENT:     Head: Normocephalic and atraumatic.  Eyes:     Conjunctiva/sclera: Conjunctivae normal.     Pupils: Pupils are equal, round, and reactive to light.  Cardiovascular:     Rate and Rhythm: Normal rate and regular rhythm.     Heart sounds: Normal heart sounds.  Pulmonary:     Effort: Pulmonary effort is normal.     Breath sounds: Normal  breath sounds.  Abdominal:     General: Bowel sounds are normal.     Palpations: Abdomen is soft.  Musculoskeletal:     Cervical back: Normal range of motion and neck supple.  Skin:    General: Skin is warm and dry.     Capillary Refill: Capillary refill takes less than 2 seconds.  Neurological:     Mental Status: He is alert and oriented to person, place, and time.  Psychiatric:        Behavior: Behavior normal.      Musculoskeletal Exam: Cervical, and lumbar spine were in good range of motion without discomfort.  Shoulder joints, elbow joints, wrist joints, MCPs PIPs and DIPs with good range of motion.  He had bilateral PIP and DIP thickening with no synovitis.  Hip joints and knee joints with good range of motion.  He had no tenderness over ankles or MTPs.  CDAI Exam: CDAI Score: -- Patient Global: --; Provider Global: -- Swollen: --; Tender: -- Joint Exam 11/13/2021   No joint exam has been documented for this visit   There is currently no information documented on the homunculus. Go to the Rheumatology activity and complete the homunculus joint exam.  Investigation: No additional findings.  Imaging: No results found.  Recent Labs: Lab Results  Component Value Date   WBC 5.5 12/06/2019   HGB 16.3 12/06/2019   PLT 218 12/06/2019   NA 139 11/14/2020   K 3.8 11/14/2020   CL 101 11/14/2020   CO2 30 11/14/2020   GLUCOSE 148 (H) 11/14/2020   BUN 11 11/14/2020   CREATININE 1.04 11/14/2020   BILITOT 1.0 11/14/2020   ALKPHOS 52 12/06/2019   AST 22 11/14/2020   ALT 22 11/14/2020   PROT 6.3 11/14/2020   ALBUMIN 4.3 12/06/2019   CALCIUM 9.3 11/14/2020   GFRAA >60 12/06/2019    Speciality Comments: No specialty comments available.  Procedures:  No procedures performed Allergies: Patient has no known allergies.   Assessment / Plan:     Visit Diagnoses: Paget's disease of bone - the total body bone scan confirmed the diagnosis of Paget's diease localized to L1 on  04/19/18. Patient had Reclast infusion in March 2020.  -His alkaline phosphatase has been stable.  On November 14, 2020 alkaline phosphatase was 50.  He had no tenderness or discomfort over the L1 region.  I will repeat her labs today.  Plan: COMPLETE METABOLIC PANEL WITH GFR  Vitamin D deficiency -he is history of vitamin D deficiency.  Vitamin D on November 15, 2020 was 52.  He takes vitamin D 2000 units daily.  Plan: VITAMIN D 25 Hydroxy (Vit-D Deficiency, Fractures)  Primary osteoarthritis of both hands-he complains of pain and stiffness in his hands.  He states the pain is related to activities.  He has morning stiffness.  Bilateral PIP and DIP thickening with no synovitis was noted.  Joint protection muscle strengthening was discussed.  Primary osteoarthritis of right knee-he  complains of intermittent discomfort in his right knee joint.  No synovitis was noted.  Muscle strength exercises were discussed.  DDD (degenerative disc disease), lumbar-he has chronic lower back pain which he relates to activities.  He had no point tenderness over the lumbar region.  A handout on back exercises was given.  Abnormal SPEP - he has been followed by heme-onc for MGUS.  He has not seen hematology since 2020.  Last labs were on December 06, 2019 which showed IgG monoclonal protein with lambda light chain specificity.  I advised him to schedule a follow-up appointment.  We will also send a staff message to the hematology department.  Essential hypertension-his blood pressure was normal today.  Orders: Orders Placed This Encounter  Procedures   COMPLETE METABOLIC PANEL WITH GFR   VITAMIN D 25 Hydroxy (Vit-D Deficiency, Fractures)   No orders of the defined types were placed in this encounter.    Follow-Up Instructions: Return for Paget's.   Bo Merino, MD  Note - This record has been created using Editor, commissioning.  Chart creation errors have been sought, but may not always  have been located.  Such creation errors do not reflect on  the standard of medical care.

## 2021-11-13 ENCOUNTER — Encounter: Payer: Self-pay | Admitting: Rheumatology

## 2021-11-13 ENCOUNTER — Ambulatory Visit: Payer: PPO | Attending: Rheumatology | Admitting: Rheumatology

## 2021-11-13 VITALS — BP 125/82 | HR 61 | Resp 16 | Ht 68.0 in | Wt 186.6 lb

## 2021-11-13 DIAGNOSIS — M19042 Primary osteoarthritis, left hand: Secondary | ICD-10-CM | POA: Diagnosis not present

## 2021-11-13 DIAGNOSIS — I10 Essential (primary) hypertension: Secondary | ICD-10-CM

## 2021-11-13 DIAGNOSIS — M19041 Primary osteoarthritis, right hand: Secondary | ICD-10-CM

## 2021-11-13 DIAGNOSIS — R778 Other specified abnormalities of plasma proteins: Secondary | ICD-10-CM | POA: Diagnosis not present

## 2021-11-13 DIAGNOSIS — M5136 Other intervertebral disc degeneration, lumbar region: Secondary | ICD-10-CM | POA: Diagnosis not present

## 2021-11-13 DIAGNOSIS — M1711 Unilateral primary osteoarthritis, right knee: Secondary | ICD-10-CM | POA: Diagnosis not present

## 2021-11-13 DIAGNOSIS — E559 Vitamin D deficiency, unspecified: Secondary | ICD-10-CM

## 2021-11-13 DIAGNOSIS — M889 Osteitis deformans of unspecified bone: Secondary | ICD-10-CM

## 2021-11-13 NOTE — Patient Instructions (Signed)

## 2021-11-14 LAB — COMPLETE METABOLIC PANEL WITH GFR
AG Ratio: 2.3 (calc) (ref 1.0–2.5)
ALT: 19 U/L (ref 9–46)
AST: 20 U/L (ref 10–35)
Albumin: 4.4 g/dL (ref 3.6–5.1)
Alkaline phosphatase (APISO): 51 U/L (ref 35–144)
BUN: 16 mg/dL (ref 7–25)
CO2: 29 mmol/L (ref 20–32)
Calcium: 9.4 mg/dL (ref 8.6–10.3)
Chloride: 102 mmol/L (ref 98–110)
Creat: 1.04 mg/dL (ref 0.70–1.28)
Globulin: 1.9 g/dL (calc) (ref 1.9–3.7)
Glucose, Bld: 95 mg/dL (ref 65–99)
Potassium: 4.1 mmol/L (ref 3.5–5.3)
Sodium: 139 mmol/L (ref 135–146)
Total Bilirubin: 1 mg/dL (ref 0.2–1.2)
Total Protein: 6.3 g/dL (ref 6.1–8.1)
eGFR: 76 mL/min/{1.73_m2} (ref >=60)

## 2021-11-14 LAB — VITAMIN D 25 HYDROXY (VIT D DEFICIENCY, FRACTURES): Vit D, 25-Hydroxy: 57 ng/mL (ref 30–100)

## 2021-11-14 NOTE — Progress Notes (Signed)
CMP is normal and vitamin D is normal.

## 2021-12-10 DIAGNOSIS — I10 Essential (primary) hypertension: Secondary | ICD-10-CM | POA: Diagnosis not present

## 2021-12-10 DIAGNOSIS — Z125 Encounter for screening for malignant neoplasm of prostate: Secondary | ICD-10-CM | POA: Diagnosis not present

## 2021-12-10 DIAGNOSIS — R7303 Prediabetes: Secondary | ICD-10-CM | POA: Diagnosis not present

## 2021-12-16 DIAGNOSIS — M889 Osteitis deformans of unspecified bone: Secondary | ICD-10-CM | POA: Diagnosis not present

## 2021-12-16 DIAGNOSIS — R7303 Prediabetes: Secondary | ICD-10-CM | POA: Diagnosis not present

## 2021-12-16 DIAGNOSIS — I10 Essential (primary) hypertension: Secondary | ICD-10-CM | POA: Diagnosis not present

## 2021-12-16 DIAGNOSIS — Z6828 Body mass index (BMI) 28.0-28.9, adult: Secondary | ICD-10-CM | POA: Diagnosis not present

## 2021-12-16 DIAGNOSIS — I700 Atherosclerosis of aorta: Secondary | ICD-10-CM | POA: Diagnosis not present

## 2022-01-08 DIAGNOSIS — N401 Enlarged prostate with lower urinary tract symptoms: Secondary | ICD-10-CM | POA: Diagnosis not present

## 2022-01-08 DIAGNOSIS — N529 Male erectile dysfunction, unspecified: Secondary | ICD-10-CM | POA: Diagnosis not present

## 2022-01-08 DIAGNOSIS — R972 Elevated prostate specific antigen [PSA]: Secondary | ICD-10-CM | POA: Diagnosis not present

## 2022-01-08 DIAGNOSIS — Z8042 Family history of malignant neoplasm of prostate: Secondary | ICD-10-CM | POA: Diagnosis not present

## 2022-01-15 DIAGNOSIS — Z23 Encounter for immunization: Secondary | ICD-10-CM | POA: Diagnosis not present

## 2022-01-16 ENCOUNTER — Encounter: Payer: Self-pay | Admitting: Gastroenterology

## 2022-01-30 DIAGNOSIS — Z23 Encounter for immunization: Secondary | ICD-10-CM | POA: Diagnosis not present

## 2022-02-25 NOTE — Progress Notes (Signed)
03/03/2022 ANTAEUS King [id number] 1950-08-21  Referring provider: Maryella Shivers, MD Primary GI doctor: Dr. Lyndel King  ASSESSMENT AND PLAN:   History of adenomatous polyp of colon 01/09/2017 colonoscopy at [facility] with Dr. Lyndel King showed colon polyp ascending: Diverticula and hemorrhoids tubular adenoma with no high-grade dysplasia.  Recall 5 years.  History of pulmonary embolism secondary to COVID Patient told to hold his Eliquis for 2 days prior to time of procedure. Will instruct when and how to resume after procedure. We will communicate with his prescribing physician Lance King to ensure that holding his Eliquis is acceptable. We discussed the risk, benefits and alternatives to colonoscopy/endoscopy.  We also discussed the low but real risk of cardiovascular event such as heart attack, stroke, embolism, thrombosis or ischemia/infarct of other organs off Eliquis and explained the need to seek urgent help if this occurs.  He is agreeable and wishes to proceed.   Patient Care Team: Lance Shivers, MD as PCP - General (Family Medicine)  HISTORY OF PRESENT ILLNESS: 71 y.o. male with a past medical history of hypertension, monoclonal gammopathy, Paget's disease, osteoarthritis, and others listed below presents for evaluation of screening colonoscopy while on Eliquis.   01/09/2017 colonoscopy at [facility] with Dr. Lyndel King showed colon polyp ascending: Diverticula and hemorrhoids tubular adenoma with no high-grade dysplasia.  Recall 5 years. 03/14/2021 echocardiogram for PE secondary to [place] with right heart strain showed ejection fraction 55 to 60%, impaired relaxation, mild mitral regurgitation, mild tricuspid regurgitation no AAS. States he was only suppose to be on it for 6 months but her remains on it. He has OV next year with him and will ask about needing to remain on the medication.  He denies every having blood clot prior, no family history.   Patient has a  BM every day, occ twice a day.  Patient denies change in bowel habits, constipation, diarrhea, hematochezia.  Denies changes in appetite, unintentional weight loss.  Patient denies GERD.  Patient denies dysphagia, nausea, vomiting, melena.    He  reports that he has never smoked. He has been exposed to tobacco smoke. He has never used smokeless tobacco. He reports current alcohol use of about 2.0 standard drinks of alcohol per week. He reports that he does not use drugs.  Current Medications:    Current Outpatient Medications (Cardiovascular):    lisinopril-hydrochlorothiazide (ZESTORETIC) 20-25 MG tablet, Take 1 tablet by mouth daily.   tadalafil (CIALIS) 5 MG tablet, tadalafil 5 mg tablet  TAKE 1 TABLET BY MOUTH DAILY.    Current Outpatient Medications (Hematological):    ELIQUIS 5 MG TABS tablet, Take 5 mg by mouth 2 (two) times daily.  Current Outpatient Medications (Other):    Cholecalciferol (VITAMIN D) 50 MCG (2000 UT) CAPS, Take by mouth daily.   dutasteride (AVODART) 0.5 MG capsule, Take 0.5 mg by mouth daily.   finasteride (PROSCAR) 5 MG tablet, finasteride 5 mg tablet  Medical History:  Past Medical History:  Diagnosis Date   Complication of anesthesia    states a smaller tube has been used in the past due to sore throat   Elevated PSA    Hypertension    EKG PCP 02/05/11 on chart   Internal hemorrhoids    MGUS (monoclonal gammopathy of unknown significance)    Paget disease of bone    Tubular adenoma of colon    Vitamin D deficiency    Allergies: No Known Allergies   Surgical History:  He  has a  past surgical history that includes Back surgery; Knee arthroscopy; and Lumbar laminectomy/decompression microdiscectomy (03/05/2011). Family History:  His family history includes Hypertension in his father and mother.  REVIEW OF SYSTEMS  : All other systems reviewed and negative except where noted in the History of Present Illness.  PHYSICAL EXAM: BP 114/74   Pulse  73   Ht '5\' 8"'$  (1.727 m)   Wt 193 lb (87.5 kg)   BMI 29.35 kg/m  General:   Pleasant, well developed male in no acute distress Head:   Normocephalic and atraumatic. Eyes:  sclerae anicteric,conjunctive pink  Heart:   regular rate and rhythm Pulm:  Clear anteriorly; no wheezing Abdomen:   Soft, Obese AB with ventral hernia, Active bowel sounds. No tenderness . Without guarding and Without rebound, No organomegaly appreciated. Rectal: Not evaluated Extremities:  Without edema. Msk: Symmetrical without gross deformities. Peripheral pulses intact.  Neurologic:  Alert and  oriented x4;  No focal deficits.  Skin:   Dry and intact without significant lesions or rashes. Psychiatric:  Cooperative. Normal mood and affect.    Lance Crofts, PA-C 10:53 AM

## 2022-02-28 ENCOUNTER — Encounter: Payer: Self-pay | Admitting: *Deleted

## 2022-03-03 ENCOUNTER — Ambulatory Visit: Payer: PPO | Admitting: Physician Assistant

## 2022-03-03 ENCOUNTER — Encounter: Payer: Self-pay | Admitting: Physician Assistant

## 2022-03-03 ENCOUNTER — Other Ambulatory Visit: Payer: Self-pay | Admitting: Gastroenterology

## 2022-03-03 VITALS — BP 114/74 | HR 73 | Ht 68.0 in | Wt 193.0 lb

## 2022-03-03 DIAGNOSIS — Z86711 Personal history of pulmonary embolism: Secondary | ICD-10-CM

## 2022-03-03 DIAGNOSIS — Z86010 Personal history of colonic polyps: Secondary | ICD-10-CM | POA: Diagnosis not present

## 2022-03-03 MED ORDER — CLENPIQ 10-3.5-12 MG-GM -GM/160ML PO SOLN: 1.0000 | ORAL | 0 refills | Status: DC

## 2022-03-03 NOTE — Patient Instructions (Signed)
You have been scheduled for a colonoscopy. Please follow written instructions given to you at your visit today.  Please pick up your prep supplies at the pharmacy within the next 1-3 days. If you use inhalers (even only as needed), please bring them with you on the day of your procedure.  _______________________________________________________  If you are age 72 or older, your body mass index should be between 23-30. Your Body mass index is 29.35 kg/m. If this is out of the aforementioned range listed, please consider follow up with your Primary Care Provider.  If you are age 18 or younger, your body mass index should be between 19-25. Your Body mass index is 29.35 kg/m. If this is out of the aformentioned range listed, please consider follow up with your Primary Care Provider.   ________________________________________________________  The [facility] [place] providers would like to encourage you to use [facility] to communicate with providers for non-urgent requests or questions.  Due to long hold times on the telephone, sending your provider a message by [facility] may be a faster and more efficient way to get a response.  Please allow 48 business hours for a response.  Please remember that this is for non-urgent requests.  _______________________________________________________  Due to recent changes in healthcare laws, you may see the results of your imaging and laboratory studies on MyChart before your provider has had a chance to review them.  We understand that in some cases there may be results that are confusing or concerning to you. Not all laboratory results come back in the same time frame and the provider may be waiting for multiple results in order to interpret others.  Please give [place] 48 hours in order for your provider to thoroughly review all the results before contacting the office for clarification of your results.

## 2022-03-04 NOTE — Telephone Encounter (Signed)
Inbound call from pharmacy. RX comes in 350 count not 320 and wants to know if that's ok. Please give a call back to confirm order. Thank you

## 2022-03-05 NOTE — Progress Notes (Signed)
Agree with assessment/plan.  Raj Reily Ilic, MD [facility] [phone]  

## 2022-03-17 ENCOUNTER — Telehealth: Payer: Self-pay | Admitting: *Deleted

## 2022-03-17 NOTE — Telephone Encounter (Signed)
Patient had PE with strain secondary to [place] 02/2021, remains on Eliquis prescribed by  Dr. Nyra Capes.  Requested to hold Eliquis for two days and then restart the medication.  Per Dr. Nyra Capes, states needs to remain on anticoagulation, I just wanted to clarify that the PCP understands we just want to hold for 2 days for endoscopic procedure and can restart at the PCP feels needed.  If Dr. Nyra Capes confirms the patient can not hold medication at all, will discuss with Dr. Lyndel Safe about proceeding with endoscopic evaluation on Eliquis.

## 2022-03-17 NOTE — Telephone Encounter (Signed)
We have received a faxed response from Dr Maryella Shivers, patient's PCP and prescribing provider for Eliquis in regards to our anticoagulation clearance request sent on 03/03/22.    On 03/14/22, Dr Nyra Capes notes: "Patient treated for pulmonary embolus. Needs continued anticoagulation. Sheral Apley"   Please advise if patient may have procedure while remaining on Eliquis.  Of note, original correspondence from this note can be found under the "media" tab in EPIC.

## 2022-03-19 DIAGNOSIS — Z139 Encounter for screening, unspecified: Secondary | ICD-10-CM | POA: Diagnosis not present

## 2022-03-19 DIAGNOSIS — Z0000 Encounter for general adult medical examination without abnormal findings: Secondary | ICD-10-CM | POA: Diagnosis not present

## 2022-03-19 DIAGNOSIS — Z136 Encounter for screening for cardiovascular disorders: Secondary | ICD-10-CM | POA: Diagnosis not present

## 2022-03-19 DIAGNOSIS — Z1331 Encounter for screening for depression: Secondary | ICD-10-CM | POA: Diagnosis not present

## 2022-03-19 DIAGNOSIS — Z1339 Encounter for screening examination for other mental health and behavioral disorders: Secondary | ICD-10-CM | POA: Diagnosis not present

## 2022-03-26 NOTE — Telephone Encounter (Signed)
Called to speak with PCP office regarding clearance letter & to clarify that patient would only need to hold Eliquis for 2 days. Spoke with Dr. Nyra Capes nurse [place] & she stated that it was clear on the clearance form for a 2 day hold and that MD did advise for him to continue medication.

## 2022-04-04 NOTE — Telephone Encounter (Signed)
Spoke with patient regarding MD recommendations. Procedure cancelled & recall placed for GI follow up in 6 months.

## 2022-04-04 NOTE — Telephone Encounter (Signed)
From the previous notes/conversation-not cleared to hold Eliquis at this time by Dr. Ronald Pippins hold off on colonoscopy for now. FU GI clinic in 6 months and then reassess RG

## 2022-04-28 ENCOUNTER — Encounter: Payer: PPO | Admitting: Gastroenterology

## 2022-05-01 DIAGNOSIS — R0981 Nasal congestion: Secondary | ICD-10-CM | POA: Diagnosis not present

## 2022-05-01 DIAGNOSIS — J0100 Acute maxillary sinusitis, unspecified: Secondary | ICD-10-CM | POA: Diagnosis not present

## 2022-05-01 DIAGNOSIS — J3489 Other specified disorders of nose and nasal sinuses: Secondary | ICD-10-CM | POA: Diagnosis not present

## 2022-06-02 ENCOUNTER — Encounter: Payer: Self-pay | Admitting: Gastroenterology

## 2022-06-10 DIAGNOSIS — R7303 Prediabetes: Secondary | ICD-10-CM | POA: Diagnosis not present

## 2022-06-10 DIAGNOSIS — I10 Essential (primary) hypertension: Secondary | ICD-10-CM | POA: Diagnosis not present

## 2022-06-16 DIAGNOSIS — I700 Atherosclerosis of aorta: Secondary | ICD-10-CM | POA: Diagnosis not present

## 2022-06-16 DIAGNOSIS — Z6828 Body mass index (BMI) 28.0-28.9, adult: Secondary | ICD-10-CM | POA: Diagnosis not present

## 2022-06-16 DIAGNOSIS — I152 Hypertension secondary to endocrine disorders: Secondary | ICD-10-CM | POA: Diagnosis not present

## 2022-06-16 DIAGNOSIS — E1159 Type 2 diabetes mellitus with other circulatory complications: Secondary | ICD-10-CM | POA: Diagnosis not present

## 2022-06-16 DIAGNOSIS — I10 Essential (primary) hypertension: Secondary | ICD-10-CM | POA: Diagnosis not present

## 2022-06-16 DIAGNOSIS — Z789 Other specified health status: Secondary | ICD-10-CM | POA: Diagnosis not present

## 2022-06-25 DIAGNOSIS — I700 Atherosclerosis of aorta: Secondary | ICD-10-CM | POA: Diagnosis not present

## 2022-06-25 DIAGNOSIS — I152 Hypertension secondary to endocrine disorders: Secondary | ICD-10-CM | POA: Diagnosis not present

## 2022-06-25 DIAGNOSIS — Z713 Dietary counseling and surveillance: Secondary | ICD-10-CM | POA: Diagnosis not present

## 2022-06-25 DIAGNOSIS — Z136 Encounter for screening for cardiovascular disorders: Secondary | ICD-10-CM | POA: Diagnosis not present

## 2022-06-25 DIAGNOSIS — E1159 Type 2 diabetes mellitus with other circulatory complications: Secondary | ICD-10-CM | POA: Diagnosis not present

## 2022-06-30 DIAGNOSIS — I700 Atherosclerosis of aorta: Secondary | ICD-10-CM | POA: Diagnosis not present

## 2022-06-30 DIAGNOSIS — E1159 Type 2 diabetes mellitus with other circulatory complications: Secondary | ICD-10-CM | POA: Diagnosis not present

## 2022-07-09 DIAGNOSIS — N401 Enlarged prostate with lower urinary tract symptoms: Secondary | ICD-10-CM | POA: Diagnosis not present

## 2022-07-09 DIAGNOSIS — R972 Elevated prostate specific antigen [PSA]: Secondary | ICD-10-CM | POA: Diagnosis not present

## 2022-07-09 DIAGNOSIS — N529 Male erectile dysfunction, unspecified: Secondary | ICD-10-CM | POA: Diagnosis not present

## 2022-07-09 DIAGNOSIS — Z8042 Family history of malignant neoplasm of prostate: Secondary | ICD-10-CM | POA: Diagnosis not present

## 2022-08-01 ENCOUNTER — Other Ambulatory Visit: Payer: Self-pay | Admitting: Family Medicine

## 2022-08-01 DIAGNOSIS — R972 Elevated prostate specific antigen [PSA]: Secondary | ICD-10-CM

## 2022-08-13 DIAGNOSIS — I152 Hypertension secondary to endocrine disorders: Secondary | ICD-10-CM | POA: Diagnosis not present

## 2022-08-13 DIAGNOSIS — E1159 Type 2 diabetes mellitus with other circulatory complications: Secondary | ICD-10-CM | POA: Diagnosis not present

## 2022-08-13 DIAGNOSIS — Z713 Dietary counseling and surveillance: Secondary | ICD-10-CM | POA: Diagnosis not present

## 2022-08-13 DIAGNOSIS — Z6828 Body mass index (BMI) 28.0-28.9, adult: Secondary | ICD-10-CM | POA: Diagnosis not present

## 2022-09-15 ENCOUNTER — Ambulatory Visit
Admission: RE | Admit: 2022-09-15 | Discharge: 2022-09-15 | Disposition: A | Discharge status: Home / Self Care | Payer: PPO | Source: Ambulatory Visit | Attending: Family Medicine | Admitting: Family Medicine

## 2022-09-15 DIAGNOSIS — R972 Elevated prostate specific antigen [PSA]: Secondary | ICD-10-CM | POA: Diagnosis not present

## 2022-09-15 DIAGNOSIS — N400 Enlarged prostate without lower urinary tract symptoms: Secondary | ICD-10-CM | POA: Diagnosis not present

## 2022-09-15 MED ORDER — GADOPICLENOL 0.5 MMOL/ML IV SOLN
8.0000 mL | Freq: Once | INTRAVENOUS | Status: AC | PRN
  Administered 2022-09-15: 8 mL via INTRAVENOUS

## 2022-10-06 ENCOUNTER — Encounter: Payer: Self-pay | Admitting: Gastroenterology

## 2022-10-06 ENCOUNTER — Ambulatory Visit: Payer: PPO | Admitting: Gastroenterology

## 2022-10-06 VITALS — BP 124/70 | HR 72 | Ht 68.0 in | Wt 184.4 lb

## 2022-10-06 DIAGNOSIS — Z86711 Personal history of pulmonary embolism: Secondary | ICD-10-CM

## 2022-10-06 DIAGNOSIS — Z86010 Personal history of colonic polyps: Secondary | ICD-10-CM

## 2022-10-06 NOTE — Progress Notes (Signed)
03/03/2022 Lance King [id number] [birth year]   Referring provider: Charlott Rakes, MD Primary GI doctor: Dr. Chales Abrahams   ASSESSMENT AND PLAN:    H/O adenomatous polyp of colon 2018 colonoscopy at [facility] health with Dr. Chales Abrahams showed colon polyp ascending: Diverticula and hemorrhoids tubular adenoma with no high-grade dysplasia.   H/O PE d/t COVID 02/2021 (off eliquis since Jan 2024)   Plan: -Colon (any samples)          HISTORY OF PRESENT ILLNESS: 72 y.o. male with a past medical history of hypertension, monoclonal gammopathy, Paget's disease, osteoarthritis, and others listed below presents for evaluation of survillance colonoscopy    01/09/2017 colonoscopy at [facility] with Dr. Chales Abrahams showed colon polyp ascending: Diverticula and hemorrhoids tubular adenoma with no high-grade dysplasia.  Recall 5 years. 03/14/2021 echocardiogram for PE secondary to COVID with right heart strain showed ejection fraction 55 to 60%, impaired relaxation, mild mitral regurgitation, mild tricuspid regurgitation no AAS. States he was only suppose to be on it for 6 months but her remains on it. He has OV next year with him and will ask about needing to remain on the medication.  He denies every having blood clot prior, no family history.    Patient has a BM every day, occ twice a day.  Patient denies change in bowel habits, constipation, diarrhea, hematochezia.  Denies changes in appetite, unintentional weight loss.  Patient denies GERD.  Patient denies dysphagia, nausea, vomiting, melena.      He  reports that he has never smoked. He has been exposed to tobacco smoke. He has never used smokeless tobacco. He reports current alcohol use of about 2.0 standard drinks of alcohol per week. He reports that he does not use drugs.   Current Medications:      Current Outpatient Medications (Cardiovascular):    lisinopril-hydrochlorothiazide (ZESTORETIC) 20-25 MG tablet, Take 1 tablet by mouth  daily.   tadalafil (CIALIS) 5 MG tablet, tadalafil 5 mg tablet  TAKE 1 TABLET BY MOUTH DAILY.   Current Outpatient Medications (Other):    Cholecalciferol (VITAMIN D) 50 MCG (2000 UT) CAPS, Take by mouth daily.   dutasteride (AVODART) 0.5 MG capsule, Take 0.5 mg by mouth daily.   finasteride (PROSCAR) 5 MG tablet, finasteride 5 mg tablet   Medical History:      Past Medical History:  Diagnosis Date   Complication of anesthesia      states a smaller tube has been used in the past due to sore throat   Elevated PSA     Hypertension      EKG PCP 02/05/11 on chart   Internal hemorrhoids     MGUS (monoclonal gammopathy of unknown significance)     Paget disease of bone     Tubular adenoma of colon     Vitamin D deficiency      Allergies: No Known Allergies    Surgical History:  He  has a past surgical history that includes Back surgery; Knee arthroscopy; and Lumbar laminectomy/decompression microdiscectomy (03/05/2011). Family History:  His family history includes Hypertension in his father and mother.   REVIEW OF SYSTEMS  : All other systems reviewed and negative except where noted in the History of Present Illness.   PHYSICAL EXAM: BP 114/74   Pulse 73   Ht 5\' 8"  (1.727 m)   Wt 193 lb (87.5 kg)   BMI 29.35 kg/m  General:   Pleasant, well developed male in no acute distress Head:   Normocephalic and  atraumatic. Eyes:  sclerae anicteric,conjunctive pink  Heart:   regular rate and rhythm Pulm:  Clear anteriorly; no wheezing Abdomen:   Soft, Obese AB with ventral hernia, Active bowel sounds. No tenderness . Without guarding and Without rebound, No organomegaly appreciated. Rectal: Not evaluated Extremities:  Without edema. Msk: Symmetrical without gross deformities. Peripheral pulses intact.  Neurologic:  Alert and  oriented x4;  No focal deficits.  Skin:   Dry and intact without significant lesions or rashes. Psychiatric:  Cooperative. Normal mood and affect.       Doree Albee, PA-C    Attending physician's note   I have taken history, reviewed the chart and examined the patient. I performed a substantive portion of this encounter, including complete performance of at least one of the key components, in conjunction with the APP. I agree with the Advanced Practitioner's note, impression and recommendations.    H/O adenomatous polyp of colon 2018 colonoscopy at [facility] health with Dr. Chales Abrahams showed colon polyp ascending: Diverticula and hemorrhoids tubular adenoma with no high-grade dysplasia.   H/O PE d/t COVID 02/2021 (off eliquis since Jan 2024)   Plan: -Colon    Discussed risks & benefits of colonoscopy. Risks including rare perforation req laparotomy, bleeding after bx/polypectomy req blood transfusion, rarely missing neoplasms, risks of anesthesia/sedation, rare risk of damage to internal organs. Benefits outweigh the risks. Patient agrees to proceed. All the questions were answered. Pt consents to proceed.  SH-drinks 1 beer per day    Edman Circle, MD [facility] [phone]

## 2022-10-06 NOTE — Patient Instructions (Signed)
_______________________________________________________  If your blood pressure at your visit was 140/90 or greater, please contact your primary care physician to follow up on this.  _______________________________________________________  If you are age 73 or older, your body mass index should be between 23-30. Your Body mass index is 28.03 kg/m. If this is out of the aforementioned range listed, please consider follow up with your Primary Care Provider.  If you are age 41 or younger, your body mass index should be between 19-25. Your Body mass index is 28.03 kg/m. If this is out of the aformentioned range listed, please consider follow up with your Primary Care Provider.   ________________________________________________________  The [facility] providers would like to encourage you to use [facility] to communicate with providers for non-urgent requests or questions.  Due to long hold times on the telephone, sending your provider a message by [facility] may be a faster and more efficient way to get a response.  Please allow 48 business hours for a response.  Please remember that this is for non-urgent requests.  _______________________________________________________  Bonita Quin have been scheduled for a colonoscopy. Please follow written instructions given to you at your visit today.   Please pick up your prep supplies at the pharmacy within the next 1-3 days.  If you use inhalers (even only as needed), please bring them with you on the day of your procedure.  DO NOT TAKE 7 DAYS PRIOR TO TEST- Trulicity (dulaglutide) Ozempic, Wegovy (semaglutide) Mounjaro (tirzepatide) Bydureon Bcise (exanatide extended release)  DO NOT TAKE 1 DAY PRIOR TO YOUR TEST Rybelsus (semaglutide) Adlyxin (lixisenatide) Victoza (liraglutide) Byetta (exanatide) ___________________________________________________________________________  Please call with any questions or concerns. You said you have a prep at home, let  us know if you need a new prep.  Thank you,  Dr. Lynann Bologna

## 2022-10-08 DIAGNOSIS — R7303 Prediabetes: Secondary | ICD-10-CM | POA: Diagnosis not present

## 2022-10-08 DIAGNOSIS — I10 Essential (primary) hypertension: Secondary | ICD-10-CM | POA: Diagnosis not present

## 2022-10-13 DIAGNOSIS — I152 Hypertension secondary to endocrine disorders: Secondary | ICD-10-CM | POA: Diagnosis not present

## 2022-10-13 DIAGNOSIS — E1159 Type 2 diabetes mellitus with other circulatory complications: Secondary | ICD-10-CM | POA: Diagnosis not present

## 2022-10-13 DIAGNOSIS — Z6827 Body mass index (BMI) 27.0-27.9, adult: Secondary | ICD-10-CM | POA: Diagnosis not present

## 2022-10-13 DIAGNOSIS — I700 Atherosclerosis of aorta: Secondary | ICD-10-CM | POA: Diagnosis not present

## 2022-10-13 DIAGNOSIS — I10 Essential (primary) hypertension: Secondary | ICD-10-CM | POA: Diagnosis not present

## 2022-10-31 ENCOUNTER — Ambulatory Visit (AMBULATORY_SURGERY_CENTER): Payer: PPO | Admitting: Gastroenterology

## 2022-10-31 ENCOUNTER — Encounter: Payer: Self-pay | Admitting: Gastroenterology

## 2022-10-31 VITALS — BP 106/65 | HR 77 | Temp 96.8°F | Resp 16 | Ht 68.0 in | Wt 184.0 lb

## 2022-10-31 DIAGNOSIS — K648 Other hemorrhoids: Secondary | ICD-10-CM | POA: Diagnosis not present

## 2022-10-31 DIAGNOSIS — Z86010 Personal history of colonic polyps: Secondary | ICD-10-CM | POA: Diagnosis not present

## 2022-10-31 DIAGNOSIS — Z09 Encounter for follow-up examination after completed treatment for conditions other than malignant neoplasm: Secondary | ICD-10-CM

## 2022-10-31 DIAGNOSIS — D122 Benign neoplasm of ascending colon: Secondary | ICD-10-CM | POA: Diagnosis not present

## 2022-10-31 DIAGNOSIS — K5730 Diverticulosis of large intestine without perforation or abscess without bleeding: Secondary | ICD-10-CM | POA: Diagnosis not present

## 2022-10-31 MED ORDER — SODIUM CHLORIDE 0.9 % IV SOLN
500.0000 mL | Freq: Once | INTRAVENOUS | Status: DC
Start: 2022-10-31 — End: 2022-10-31

## 2022-10-31 NOTE — Patient Instructions (Addendum)
Recommendation:           - Patient has a contact number available for                            emergencies. The signs and symptoms of potential                            delayed complications were discussed with the                            patient. Return to normal activities tomorrow.                            Written discharge instructions were provided to the                            patient.                           - Resume previous diet.                           - Continue present medications.                           - No aspirin, ibuprofen, naproxen, or other                            non-steroidal anti-inflammatory drugs for 5 days                            after polyp removal.                           - Await pathology results.                           - Repeat colonoscopy in 3 years for surveillance                            based on pathology results.                           - The findings and recommendations were discussed                            with the patient's family.  Handouts on polyps, diverticulosis and hemorrhoids given.  YOU HAD AN ENDOSCOPIC PROCEDURE TODAY AT THE [facility]:   Refer to the procedure report that was given to you for any specific questions about what was found during the examination.  If the procedure report does not answer your questions, please call your gastroenterologist to clarify.  If you requested that your care partner not be given the details of your procedure findings, then the procedure report has been included in a sealed envelope for you to review at your convenience later.  YOU SHOULD EXPECT: Some feelings of bloating in  the abdomen. Passage of more gas than usual.  Walking can help get rid of the air that was put into your GI tract during the procedure and reduce the bloating. If you had a lower endoscopy (such as a colonoscopy or flexible sigmoidoscopy) you may notice spotting of blood in your stool or  on the toilet paper. If you underwent a bowel prep for your procedure, you may not have a normal bowel movement for a few days.  Please Note:  You might notice some irritation and congestion in your nose or some drainage.  This is from the oxygen used during your procedure.  There is no need for concern and it should clear up in a day or so.  SYMPTOMS TO REPORT IMMEDIATELY:  Following lower endoscopy (colonoscopy or flexible sigmoidoscopy):  Excessive amounts of blood in the stool  Significant tenderness or worsening of abdominal pains  Swelling of the abdomen that is new, acute  Fever of 100F or higher  For urgent or emergent issues, a gastroenterologist can be reached at any hour by calling [phone]. Do not use MyChart messaging for urgent concerns.    DIET:  We do recommend a small meal at first, but then you may proceed to your regular diet.  Drink plenty of fluids but you should avoid alcoholic beverages for 24 hours.  ACTIVITY:  You should plan to take it easy for the rest of today and you should NOT DRIVE or use heavy machinery until tomorrow (because of the sedation medicines used during the test).    FOLLOW UP: Our staff will call the number listed on your records the next business day following your procedure.  We will call around 7:15- 8:00 am to check on you and address any questions or concerns that you may have regarding the information given to you following your procedure. If we do not reach you, we will leave a message.     If any biopsies were taken you will be contacted by phone or by letter within the next 1-3 weeks.  Please call us at [phone] if you have not heard about the biopsies in 3 weeks.    SIGNATURES/CONFIDENTIALITY: You and/or your care partner have signed paperwork which will be entered into your electronic medical record.  These signatures attest to the fact that that the information above on your After Visit Summary has been reviewed and is  understood.  Full responsibility of the confidentiality of this discharge information lies with you and/or your care-partner.

## 2022-10-31 NOTE — Progress Notes (Signed)
I found no evidence of a difficult intubation in Epic. Patient tolerated anesthetic well with no complications.   Report to pacu rn. Vss. Care resumed by rn.

## 2022-10-31 NOTE — Progress Notes (Signed)
Pt's states no medical or surgical changes since previsit or office visit. 

## 2022-10-31 NOTE — Op Note (Signed)
[facility] Patient Name: Lance King Procedure Date: 10/31/2022 8:32 AM MRN: [id number] Endoscopist: Lynann Bologna , MD, [id number] Age: 72 Referring MD:  Date of Birth: 1951-01-24 Gender: Male Account #: [id number] Procedure:                Colonoscopy Indications:              High risk colon cancer surveillance: Personal                            history of colonic polyps Medicines:                Monitored Anesthesia Care Procedure:                Pre-Anesthesia Assessment:                           - Prior to the procedure, a History and Physical                            was performed, and patient medications and                            allergies were reviewed. The patient's tolerance of                            previous anesthesia was also reviewed. The risks                            and benefits of the procedure and the sedation                            options and risks were discussed with the patient.                            All questions were answered, and informed consent                            was obtained. Prior Anticoagulants: The patient has                            taken no anticoagulant or antiplatelet agents. ASA                            Grade Assessment: II - A patient with mild systemic                            disease. After reviewing the risks and benefits,                            the patient was deemed in satisfactory condition to                            undergo the procedure.  After obtaining informed consent, the colonoscope                            was passed under direct vision. Throughout the                            procedure, the patient's blood pressure, pulse, and                            oxygen saturations were monitored continuously. The                            Olympus PCF-HQ190L ([id number]) Colonoscope was                            introduced through the anus and advanced to  the the                            cecum, identified by appendiceal orifice and                            ileocecal valve. The colonoscopy was performed                            without difficulty. The patient tolerated the                            procedure well. The quality of the bowel                            preparation was adequate to identify polyps. The                            ileocecal valve, appendiceal orifice, and rectum                            were photographed. Scope In: 8:42:28 AM Scope Out: 8:58:23 AM Scope Withdrawal Time: 0 hours 13 minutes 25 seconds  Total Procedure Duration: 0 hours 15 minutes 55 seconds  Findings:                 Three sessile and semi-sessile polyps were found in                            the proximal ascending colon, mid ascending colon                            and distal ascending colon. The polyps were 6 to 10                            mm in size. These polyps were removed with a cold                            snare. Resection and retrieval were complete.  A few small-mouthed diverticula were found in the                            sigmoid colon.                           Non-bleeding internal hemorrhoids were found during                            retroflexion. The hemorrhoids were small and Grade                            I (internal hemorrhoids that do not prolapse).                           The exam was otherwise without abnormality on                            direct and retroflexion views. Complications:            No immediate complications. Estimated Blood Loss:     Estimated blood loss: none. Impression:               - Three 6 to 10 mm polyps in the proximal ascending                            colon, in the mid ascending colon and in the distal                            ascending colon, removed with a cold snare.                            Resected and retrieved.                            - Minimal sigmoid diverticulosis                           - Non-bleeding internal hemorrhoids.                           - The examination was otherwise normal on direct                            and retroflexion views. Recommendation:           - Patient has a contact number available for                            emergencies. The signs and symptoms of potential                            delayed complications were discussed with the                            patient. Return to normal activities tomorrow.  Written discharge instructions were provided to the                            patient.                           - Resume previous diet.                           - Continue present medications.                           - No aspirin, ibuprofen, naproxen, or other                            non-steroidal anti-inflammatory drugs for 5 days                            after polyp removal.                           - Await pathology results.                           - Repeat colonoscopy in 3 years for surveillance                            based on pathology results.                           - The findings and recommendations were discussed                            with the patient's family. Lynann Bologna, MD 10/31/2022 9:04:36 AM This report has been signed electronically.

## 2022-11-03 ENCOUNTER — Telehealth: Payer: Self-pay | Admitting: *Deleted

## 2022-11-03 NOTE — Telephone Encounter (Signed)
  Follow up Call-     10/31/2022    7:33 AM  Call back number  Post procedure Call Back phone  # [phone]  Permission to leave phone message Yes     Patient questions:  Do you have a fever, pain , or abdominal swelling? No. Pain Score  0 *  Have you tolerated food without any problems? Yes.    Have you been able to return to your normal activities? Yes.    Do you have any questions about your discharge instructions: Diet   No. Medications  No. Follow up visit  No.  Do you have questions or concerns about your Care? No.  Actions: * If pain score is 4 or above: No action needed, pain <4.

## 2022-11-06 NOTE — Progress Notes (Signed)
Office Visit Note  Patient: Lance King             Date of Birth: 09-27-1950           MRN: [id number]             PCP: Charlott Rakes, MD Referring: Charlott Rakes, MD Visit Date: 11/19/2022 Occupation: @GUAROCC @  Subjective:  Joint stiffness  History of Present Illness: Lance King is a 72 y.o. male with history of Paget's and osteoarthritis.  He returns for the follow-up visit today.  He denies any joint pain or discomfort.  He has some stiffness in his hands.  He continues to work at [employer].  He denies any discomfort in his lower back today.  He has not experienced any pain in his left knee recently.    Activities of Daily Living:  Patient reports morning stiffness for 2 minutes.   Patient Denies nocturnal pain.  Difficulty dressing/grooming: Denies Difficulty climbing stairs: Denies Difficulty getting out of chair: Denies Difficulty using hands for taps, buttons, cutlery, and/or writing: Denies  Review of Systems  Constitutional:  Negative for fatigue.  HENT:  Negative for mouth sores and mouth dryness.   Eyes:  Negative for dryness.  Respiratory:  Negative for shortness of breath.   Cardiovascular:  Negative for chest pain and palpitations.  Gastrointestinal:  Negative for blood in stool, constipation and diarrhea.  Endocrine: Negative for increased urination.  Genitourinary:  Negative for involuntary urination.  Musculoskeletal:  Negative for joint pain, gait problem, joint pain, joint swelling, myalgias, muscle weakness, morning stiffness, muscle tenderness and myalgias.  Skin:  Negative for color change, rash, hair loss and sensitivity to sunlight.  Allergic/Immunologic: Negative for susceptible to infections.  Neurological:  Negative for dizziness and headaches.  Hematological:  Negative for swollen glands.  Psychiatric/Behavioral:  Negative for depressed mood and sleep disturbance. The patient is not nervous/anxious.     PMFS History:   Patient Active Problem List   Diagnosis Date Noted   Paget's disease of bone 04/20/2018   MGUS (monoclonal gammopathy of unknown significance) 04/20/2018   Vitamin D deficiency 04/20/2018   DDD (degenerative disc disease), lumbar 04/06/2018   Primary osteoarthritis of right knee 04/06/2018   Adhesive capsulitis of right shoulder 04/06/2018   Essential hypertension 04/06/2018    Past Medical History:  Diagnosis Date   Complication of anesthesia    states a smaller tube has been used in the past due to sore throat   Elevated PSA    Hypertension    EKG PCP 02/05/11 on chart   Internal hemorrhoids    MGUS (monoclonal gammopathy of unknown significance)    Paget disease of bone    Tubular adenoma of colon    Vitamin D deficiency     Family History  Problem Relation Age of Onset   Hypertension Mother    Hypertension Father    Colon cancer Neg Hx    Esophageal cancer Neg Hx    Rectal cancer Neg Hx    Stomach cancer Neg Hx    Past Surgical History:  Procedure Laterality Date   BACK SURGERY     Lumbar decompression 1994   COLONOSCOPY  01/09/2017   Colonic polyps status post polypectomy. Small internal hemorrhoids   KNEE ARTHROSCOPY     1987   LUMBAR LAMINECTOMY/DECOMPRESSION MICRODISCECTOMY  03/05/2011   Procedure: LUMBAR LAMINECTOMY/DECOMPRESSION MICRODISCECTOMY;  Surgeon: Javier Docker;  Location: [facility];  Service: Orthopedics;  Laterality: N/A;  Re  do Decompression L3-L5  (X-Ray)    Central   Social History   Social History Narrative   Not on file   Immunization History  Administered Date(s) Administered   PFIZER(Purple Top)SARS-COV-2 Vaccination 04/17/2019, 05/07/2019, 01/19/2020, 08/07/2020   Zoster Recombinant(Shingrix) 01/28/2018     Objective: Vital Signs: BP 127/84 (BP Location: Left Arm, Patient Position: Sitting, Cuff Size: Normal)   Pulse 70   Resp 14   Ht 5\' 8"  (1.727 m)   Wt 183 lb (83 kg)   BMI 27.83 kg/m    Physical Exam Vitals and nursing  note reviewed.  Constitutional:      Appearance: He is well-developed.  HENT:     Head: Normocephalic and atraumatic.  Eyes:     Conjunctiva/sclera: Conjunctivae normal.     Pupils: Pupils are equal, round, and reactive to light.  Cardiovascular:     Rate and Rhythm: Normal rate and regular rhythm.     Heart sounds: Normal heart sounds.  Pulmonary:     Effort: Pulmonary effort is normal.     Breath sounds: Normal breath sounds.  Abdominal:     General: Bowel sounds are normal.     Palpations: Abdomen is soft.  Musculoskeletal:     Cervical back: Normal range of motion and neck supple.  Skin:    General: Skin is warm and dry.     Capillary Refill: Capillary refill takes less than 2 seconds.  Neurological:     Mental Status: He is alert and oriented to person, place, and time.  Psychiatric:        Behavior: Behavior normal.      Musculoskeletal Exam: He had limited lateral rotation without discomfort.  He good mobility in his lumbar spine without any point tenderness.  Shoulder joints, elbow joints, wrist joints in good range of motion.  He had bilateral PIP and DIP thickening with no synovitis.  Hip joints and knee joints in good range of motion without any warmth swelling or effusion.  There was no tenderness over ankles or MTPs.  CDAI Exam: CDAI Score: -- Patient Global: --; Provider Global: -- Swollen: --; Tender: -- Joint Exam 11/19/2022   No joint exam has been documented for this visit   There is currently no information documented on the homunculus. Go to the Rheumatology activity and complete the homunculus joint exam.  Investigation: No additional findings.  Imaging: No results found.  Recent Labs: Lab Results  Component Value Date   WBC 5.5 12/06/2019   HGB 16.3 12/06/2019   PLT 218 12/06/2019   NA 139 11/13/2021   K 4.1 11/13/2021   CL 102 11/13/2021   CO2 29 11/13/2021   GLUCOSE 95 11/13/2021   BUN 16 11/13/2021   CREATININE 1.04 11/13/2021    BILITOT 1.0 11/13/2021   ALKPHOS 52 12/06/2019   AST 20 11/13/2021   ALT 19 11/13/2021   PROT 6.3 11/13/2021   ALBUMIN 4.3 12/06/2019   CALCIUM 9.4 11/13/2021   GFRAA >60 12/06/2019    Speciality Comments: No specialty comments available.  Procedures:  No procedures performed Allergies: Patient has no known allergies.   Assessment / Plan:     Visit Diagnoses: Paget's disease of bone - the total body bone scan confirmed the diagnosis of Paget's diease localized to L1 on 04/19/18. Patient had Reclast infusion in March 2020.  Patient had no recurrence of discomfort in his lumbar spine since then.  Labs obtained last year in August 2023 showed normal alk phos of 51.  Vitamin D deficiency-patient has been taking vitamin D.  Will check vitamin D level today.  Primary osteoarthritis of both hands-he had bilateral PIP and DIP thickening with no synovitis.  Joint protection muscle strengthening was discussed.  Primary osteoarthritis of right knee-he denies any discomfort today.  No joint warmth swelling or effusion was noted.  DDD (degenerative disc disease), lumbar-he denies lower back pain.  Abnormal SPEP - he has been followed by heme-onc for MGUS.  Essential hypertension-blood pressure was normal at 127/84.  Orders: Orders Placed This Encounter  Procedures   COMPLETE METABOLIC PANEL WITH GFR   VITAMIN D 25 Hydroxy (Vit-D Deficiency, Fractures)   No orders of the defined types were placed in this encounter.    Follow-Up Instructions: Return in about 1 year (around 11/19/2023) for Paget's.   Pollyann Savoy, MD  Note - This record has been created using Animal nutritionist.  Chart creation errors have been sought, but may not always  have been located. Such creation errors do not reflect on  the standard of medical care.

## 2022-11-07 ENCOUNTER — Encounter: Payer: Self-pay | Admitting: Gastroenterology

## 2022-11-19 ENCOUNTER — Ambulatory Visit: Payer: PPO | Attending: Rheumatology | Admitting: Rheumatology

## 2022-11-19 ENCOUNTER — Encounter: Payer: Self-pay | Admitting: Rheumatology

## 2022-11-19 VITALS — BP 127/84 | HR 70 | Resp 14 | Ht 68.0 in | Wt 183.0 lb

## 2022-11-19 DIAGNOSIS — M19042 Primary osteoarthritis, left hand: Secondary | ICD-10-CM | POA: Diagnosis not present

## 2022-11-19 DIAGNOSIS — M19041 Primary osteoarthritis, right hand: Secondary | ICD-10-CM | POA: Diagnosis not present

## 2022-11-19 DIAGNOSIS — I10 Essential (primary) hypertension: Secondary | ICD-10-CM

## 2022-11-19 DIAGNOSIS — R778 Other specified abnormalities of plasma proteins: Secondary | ICD-10-CM

## 2022-11-19 DIAGNOSIS — M1711 Unilateral primary osteoarthritis, right knee: Secondary | ICD-10-CM | POA: Diagnosis not present

## 2022-11-19 DIAGNOSIS — E559 Vitamin D deficiency, unspecified: Secondary | ICD-10-CM

## 2022-11-19 DIAGNOSIS — M889 Osteitis deformans of unspecified bone: Secondary | ICD-10-CM | POA: Diagnosis not present

## 2022-11-19 DIAGNOSIS — M5136 Other intervertebral disc degeneration, lumbar region: Secondary | ICD-10-CM | POA: Diagnosis not present

## 2022-11-20 LAB — COMPLETE METABOLIC PANEL WITH GFR
AG Ratio: 2 (calc) (ref 1.0–2.5)
ALT: 18 U/L (ref 9–46)
AST: 19 U/L (ref 10–35)
Albumin: 4.5 g/dL (ref 3.6–5.1)
Alkaline phosphatase (APISO): 51 U/L (ref 35–144)
BUN: 14 mg/dL (ref 7–25)
CO2: 29 mmol/L (ref 20–32)
Calcium: 10 mg/dL (ref 8.6–10.3)
Chloride: 101 mmol/L (ref 98–110)
Creat: 1.08 mg/dL (ref 0.70–1.28)
Globulin: 2.2 g/dL (calc) (ref 1.9–3.7)
Glucose, Bld: 99 mg/dL (ref 65–99)
Potassium: 4.2 mmol/L (ref 3.5–5.3)
Sodium: 139 mmol/L (ref 135–146)
Total Bilirubin: 0.8 mg/dL (ref 0.2–1.2)
Total Protein: 6.7 g/dL (ref 6.1–8.1)
eGFR: 72 mL/min/{1.73_m2} (ref >=60)

## 2022-11-20 LAB — VITAMIN D 25 HYDROXY (VIT D DEFICIENCY, FRACTURES): Vit D, 25-Hydroxy: 62 ng/mL (ref 30–100)

## 2022-11-20 NOTE — Progress Notes (Signed)
CMP including alkaline phosphatase is normal.  Vitamin D is normal.

## 2022-12-25 DIAGNOSIS — Z23 Encounter for immunization: Secondary | ICD-10-CM | POA: Diagnosis not present

## 2023-01-12 DIAGNOSIS — Z8042 Family history of malignant neoplasm of prostate: Secondary | ICD-10-CM | POA: Diagnosis not present

## 2023-01-12 DIAGNOSIS — R972 Elevated prostate specific antigen [PSA]: Secondary | ICD-10-CM | POA: Diagnosis not present

## 2023-01-12 DIAGNOSIS — N401 Enlarged prostate with lower urinary tract symptoms: Secondary | ICD-10-CM | POA: Diagnosis not present

## 2023-02-10 DIAGNOSIS — E1159 Type 2 diabetes mellitus with other circulatory complications: Secondary | ICD-10-CM | POA: Diagnosis not present

## 2023-02-16 DIAGNOSIS — Z1331 Encounter for screening for depression: Secondary | ICD-10-CM | POA: Diagnosis not present

## 2023-02-16 DIAGNOSIS — Z136 Encounter for screening for cardiovascular disorders: Secondary | ICD-10-CM | POA: Diagnosis not present

## 2023-02-16 DIAGNOSIS — E669 Obesity, unspecified: Secondary | ICD-10-CM | POA: Diagnosis not present

## 2023-02-16 DIAGNOSIS — I700 Atherosclerosis of aorta: Secondary | ICD-10-CM | POA: Diagnosis not present

## 2023-02-16 DIAGNOSIS — I152 Hypertension secondary to endocrine disorders: Secondary | ICD-10-CM | POA: Diagnosis not present

## 2023-02-16 DIAGNOSIS — Z139 Encounter for screening, unspecified: Secondary | ICD-10-CM | POA: Diagnosis not present

## 2023-02-16 DIAGNOSIS — Z6828 Body mass index (BMI) 28.0-28.9, adult: Secondary | ICD-10-CM | POA: Diagnosis not present

## 2023-02-16 DIAGNOSIS — Z1339 Encounter for screening examination for other mental health and behavioral disorders: Secondary | ICD-10-CM | POA: Diagnosis not present

## 2023-02-16 DIAGNOSIS — I10 Essential (primary) hypertension: Secondary | ICD-10-CM | POA: Diagnosis not present

## 2023-02-16 DIAGNOSIS — E1159 Type 2 diabetes mellitus with other circulatory complications: Secondary | ICD-10-CM | POA: Diagnosis not present

## 2023-02-16 DIAGNOSIS — Z0000 Encounter for general adult medical examination without abnormal findings: Secondary | ICD-10-CM | POA: Diagnosis not present

## 2023-02-16 DIAGNOSIS — Z23 Encounter for immunization: Secondary | ICD-10-CM | POA: Diagnosis not present

## 2023-04-13 DIAGNOSIS — M1611 Unilateral primary osteoarthritis, right hip: Secondary | ICD-10-CM | POA: Diagnosis not present

## 2023-05-08 DIAGNOSIS — R0981 Nasal congestion: Secondary | ICD-10-CM | POA: Diagnosis not present

## 2023-05-08 DIAGNOSIS — R051 Acute cough: Secondary | ICD-10-CM | POA: Diagnosis not present

## 2023-05-08 DIAGNOSIS — R509 Fever, unspecified: Secondary | ICD-10-CM | POA: Diagnosis not present

## 2023-05-08 DIAGNOSIS — R5383 Other fatigue: Secondary | ICD-10-CM | POA: Diagnosis not present

## 2023-05-08 DIAGNOSIS — R070 Pain in throat: Secondary | ICD-10-CM | POA: Diagnosis not present

## 2023-05-11 DIAGNOSIS — Z6829 Body mass index (BMI) 29.0-29.9, adult: Secondary | ICD-10-CM | POA: Diagnosis not present

## 2023-05-11 DIAGNOSIS — Z20822 Contact with and (suspected) exposure to covid-19: Secondary | ICD-10-CM | POA: Diagnosis not present

## 2023-05-11 DIAGNOSIS — J101 Influenza due to other identified influenza virus with other respiratory manifestations: Secondary | ICD-10-CM | POA: Diagnosis not present

## 2023-05-25 DIAGNOSIS — M1611 Unilateral primary osteoarthritis, right hip: Secondary | ICD-10-CM | POA: Diagnosis not present

## 2023-05-26 DIAGNOSIS — M1611 Unilateral primary osteoarthritis, right hip: Secondary | ICD-10-CM | POA: Diagnosis not present

## 2023-05-26 DIAGNOSIS — Z01818 Encounter for other preprocedural examination: Secondary | ICD-10-CM | POA: Diagnosis not present

## 2023-05-26 DIAGNOSIS — Z6828 Body mass index (BMI) 28.0-28.9, adult: Secondary | ICD-10-CM | POA: Diagnosis not present

## 2023-06-17 DIAGNOSIS — E1159 Type 2 diabetes mellitus with other circulatory complications: Secondary | ICD-10-CM | POA: Diagnosis not present

## 2023-06-22 DIAGNOSIS — Z6828 Body mass index (BMI) 28.0-28.9, adult: Secondary | ICD-10-CM | POA: Diagnosis not present

## 2023-06-22 DIAGNOSIS — E1159 Type 2 diabetes mellitus with other circulatory complications: Secondary | ICD-10-CM | POA: Diagnosis not present

## 2023-06-22 DIAGNOSIS — I152 Hypertension secondary to endocrine disorders: Secondary | ICD-10-CM | POA: Diagnosis not present

## 2023-06-22 DIAGNOSIS — E785 Hyperlipidemia, unspecified: Secondary | ICD-10-CM | POA: Diagnosis not present

## 2023-06-22 DIAGNOSIS — I10 Essential (primary) hypertension: Secondary | ICD-10-CM | POA: Diagnosis not present

## 2023-07-16 DIAGNOSIS — Z7189 Other specified counseling: Secondary | ICD-10-CM | POA: Diagnosis not present

## 2023-07-16 DIAGNOSIS — E1159 Type 2 diabetes mellitus with other circulatory complications: Secondary | ICD-10-CM | POA: Diagnosis not present

## 2023-08-13 DIAGNOSIS — R972 Elevated prostate specific antigen [PSA]: Secondary | ICD-10-CM | POA: Diagnosis not present

## 2023-08-13 DIAGNOSIS — N401 Enlarged prostate with lower urinary tract symptoms: Secondary | ICD-10-CM | POA: Diagnosis not present

## 2023-08-13 DIAGNOSIS — Z8042 Family history of malignant neoplasm of prostate: Secondary | ICD-10-CM | POA: Diagnosis not present

## 2023-08-13 DIAGNOSIS — N529 Male erectile dysfunction, unspecified: Secondary | ICD-10-CM | POA: Diagnosis not present

## 2023-09-16 DIAGNOSIS — Z8042 Family history of malignant neoplasm of prostate: Secondary | ICD-10-CM | POA: Diagnosis not present

## 2023-09-16 DIAGNOSIS — N401 Enlarged prostate with lower urinary tract symptoms: Secondary | ICD-10-CM | POA: Diagnosis not present

## 2023-09-16 DIAGNOSIS — R972 Elevated prostate specific antigen [PSA]: Secondary | ICD-10-CM | POA: Diagnosis not present

## 2023-09-24 DIAGNOSIS — R972 Elevated prostate specific antigen [PSA]: Secondary | ICD-10-CM | POA: Diagnosis not present

## 2023-09-24 DIAGNOSIS — N401 Enlarged prostate with lower urinary tract symptoms: Secondary | ICD-10-CM | POA: Diagnosis not present

## 2023-09-24 DIAGNOSIS — Z8042 Family history of malignant neoplasm of prostate: Secondary | ICD-10-CM | POA: Diagnosis not present

## 2023-10-12 DIAGNOSIS — E1159 Type 2 diabetes mellitus with other circulatory complications: Secondary | ICD-10-CM | POA: Diagnosis not present

## 2023-10-19 DIAGNOSIS — Z6827 Body mass index (BMI) 27.0-27.9, adult: Secondary | ICD-10-CM | POA: Diagnosis not present

## 2023-10-19 DIAGNOSIS — I152 Hypertension secondary to endocrine disorders: Secondary | ICD-10-CM | POA: Diagnosis not present

## 2023-10-19 DIAGNOSIS — I700 Atherosclerosis of aorta: Secondary | ICD-10-CM | POA: Diagnosis not present

## 2023-10-19 DIAGNOSIS — I10 Essential (primary) hypertension: Secondary | ICD-10-CM | POA: Diagnosis not present

## 2023-10-19 DIAGNOSIS — E1159 Type 2 diabetes mellitus with other circulatory complications: Secondary | ICD-10-CM | POA: Diagnosis not present

## 2023-11-04 NOTE — Progress Notes (Deleted)
 Office Visit Note  Patient: Lance King             Date of Birth: 1951/02/09           MRN: [id number]             PCP: Trinidad Glisson, MD Referring: Trinidad Glisson, MD Visit Date: 11/18/2023 Occupation: @GUAROCC @  Subjective:  No chief complaint on file.   History of Present Illness: Lance King is a 73 y.o. male ***     Activities of Daily Living:  Patient reports morning stiffness for *** {minute/hour:19697}.   Patient {ACTIONS;DENIES/REPORTS:21021675::Denies} nocturnal pain.  Difficulty dressing/grooming: {ACTIONS;DENIES/REPORTS:21021675::Denies} Difficulty climbing stairs: {ACTIONS;DENIES/REPORTS:21021675::Denies} Difficulty getting out of chair: {ACTIONS;DENIES/REPORTS:21021675::Denies} Difficulty using hands for taps, buttons, cutlery, and/or writing: {ACTIONS;DENIES/REPORTS:21021675::Denies}  No Rheumatology ROS completed.   PMFS History:  Patient Active Problem List   Diagnosis Date Noted   Paget's disease of bone 04/20/2018   MGUS (monoclonal gammopathy of unknown significance) 04/20/2018   Vitamin D  deficiency 04/20/2018   DDD (degenerative disc disease), lumbar 04/06/2018   Primary osteoarthritis of right knee 04/06/2018   Adhesive capsulitis of right shoulder 04/06/2018   Essential hypertension 04/06/2018    Past Medical History:  Diagnosis Date   Complication of anesthesia    states a smaller tube has been used in the past due to sore throat   Elevated PSA    Hypertension    EKG PCP 02/05/11 on chart   Internal hemorrhoids    MGUS (monoclonal gammopathy of unknown significance)    Paget disease of bone    Tubular adenoma of colon    Vitamin D  deficiency     Family History  Problem Relation Age of Onset   Hypertension Mother    Hypertension Father    Colon cancer Neg Hx    Esophageal cancer Neg Hx    Rectal cancer Neg Hx    Stomach cancer Neg Hx    Past Surgical History:  Procedure Laterality Date   BACK SURGERY      Lumbar decompression 1994   COLONOSCOPY  01/09/2017   Colonic polyps status post polypectomy. Small internal hemorrhoids   KNEE ARTHROSCOPY     1987   LUMBAR LAMINECTOMY/DECOMPRESSION MICRODISCECTOMY  03/05/2011   Procedure: LUMBAR LAMINECTOMY/DECOMPRESSION MICRODISCECTOMY;  Surgeon: Reyes JAYSON Billing;  Location: [facility];  Service: Orthopedics;  Laterality: N/A;  Re do Decompression L3-L5  (X-Ray)    Central   Social History   Social History Narrative   Not on file   Immunization History  Administered Date(s) Administered   PFIZER(Purple Top)SARS-COV-2 Vaccination 04/17/2019, 05/07/2019, 01/19/2020, 08/07/2020   Zoster Recombinant(Shingrix) 01/28/2018     Objective: Vital Signs: There were no vitals taken for this visit.   Physical Exam   Musculoskeletal Exam: ***  CDAI Exam: CDAI Score: -- Patient Global: --; Provider Global: -- Swollen: --; Tender: -- Joint Exam 11/18/2023   No joint exam has been documented for this visit   There is currently no information documented on the homunculus. Go to the Rheumatology activity and complete the homunculus joint exam.  Investigation: No additional findings.  Imaging: No results found.  Recent Labs: Lab Results  Component Value Date   WBC 5.5 12/06/2019   HGB 16.3 12/06/2019   PLT 218 12/06/2019   NA 139 11/19/2022   K 4.2 11/19/2022   CL 101 11/19/2022   CO2 29 11/19/2022   GLUCOSE 99 11/19/2022   BUN 14 11/19/2022   CREATININE 1.08 11/19/2022   BILITOT 0.8  11/19/2022   ALKPHOS 52 12/06/2019   AST 19 11/19/2022   ALT 18 11/19/2022   PROT 6.7 11/19/2022   ALBUMIN 4.3 12/06/2019   CALCIUM 10.0 11/19/2022   GFRAA >60 12/06/2019    Speciality Comments: No specialty comments available.  Procedures:  No procedures performed Allergies: Patient has no known allergies.   Assessment / Plan:     Visit Diagnoses: No diagnosis found.  Orders: No orders of the defined types were placed in this encounter.  No  orders of the defined types were placed in this encounter.   Face-to-face time spent with patient was *** minutes. Greater than 50% of time was spent in counseling and coordination of care.  Follow-Up Instructions: No follow-ups on file.   Daved JAYSON Gavel, CMA  Note - This record has been created using Animal nutritionist.  Chart creation errors have been sought, but may not always  have been located. Such creation errors do not reflect on  the standard of medical care.

## 2023-11-18 ENCOUNTER — Ambulatory Visit: Payer: PPO | Admitting: Rheumatology

## 2023-11-18 DIAGNOSIS — M19041 Primary osteoarthritis, right hand: Secondary | ICD-10-CM

## 2023-11-18 DIAGNOSIS — E559 Vitamin D deficiency, unspecified: Secondary | ICD-10-CM

## 2023-11-18 DIAGNOSIS — M1711 Unilateral primary osteoarthritis, right knee: Secondary | ICD-10-CM

## 2023-11-18 DIAGNOSIS — M47816 Spondylosis without myelopathy or radiculopathy, lumbar region: Secondary | ICD-10-CM

## 2023-11-18 DIAGNOSIS — M889 Osteitis deformans of unspecified bone: Secondary | ICD-10-CM

## 2023-11-18 DIAGNOSIS — R778 Other specified abnormalities of plasma proteins: Secondary | ICD-10-CM

## 2023-11-18 DIAGNOSIS — I10 Essential (primary) hypertension: Secondary | ICD-10-CM

## 2023-12-02 ENCOUNTER — Ambulatory Visit: Payer: Self-pay | Admitting: Student

## 2023-12-02 NOTE — Progress Notes (Signed)
 Sent message, via epic in basket, requesting orders in epic from Careers adviser.

## 2023-12-02 NOTE — H&P (Signed)
 TOTAL HIP ADMISSION H&P  Patient is admitted for right total hip arthroplasty.  Subjective:  Chief Complaint: right hip pain  HPI: Lance King, 73 y.o. male, has a history of pain and functional disability in the right hip(s) due to arthritis and patient has failed non-surgical conservative treatments for greater than 12 weeks to include NSAID's and/or analgesics, flexibility and strengthening excercises, use of assistive devices, and activity modification.  Onset of symptoms was gradual starting 10 years ago with rapidlly worsening course since that time.The patient noted no past surgery on the right hip(s).  Patient currently rates pain in the right hip at 10 out of 10 with activity. Patient has night pain, worsening of pain with activity and weight bearing, trendelenberg gait, pain that interfers with activities of daily living, and pain with passive range of motion. Patient has evidence of subchondral cysts, subchondral sclerosis, periarticular osteophytes, and joint space narrowing by imaging studies. This condition presents safety issues increasing the risk of falls.  There is no current active infection.  Patient Active Problem List   Diagnosis Date Noted   Paget's disease of bone 04/20/2018   MGUS (monoclonal gammopathy of unknown significance) 04/20/2018   Vitamin D  deficiency 04/20/2018   DDD (degenerative disc disease), lumbar 04/06/2018   Primary osteoarthritis of right knee 04/06/2018   Adhesive capsulitis of right shoulder 04/06/2018   Essential hypertension 04/06/2018   Past Medical History:  Diagnosis Date   Complication of anesthesia    states a smaller tube has been used in the past due to sore throat   Elevated PSA    Hypertension    EKG PCP 02/05/11 on chart   Internal hemorrhoids    MGUS (monoclonal gammopathy of unknown significance)    Paget disease of bone    Tubular adenoma of colon    Vitamin D  deficiency     Past Surgical History:  Procedure Laterality  Date   BACK SURGERY     Lumbar decompression 1994   COLONOSCOPY  01/09/2017   Colonic polyps status post polypectomy. Small internal hemorrhoids   KNEE ARTHROSCOPY     1987   LUMBAR LAMINECTOMY/DECOMPRESSION MICRODISCECTOMY  03/05/2011   Procedure: LUMBAR LAMINECTOMY/DECOMPRESSION MICRODISCECTOMY;  Surgeon: Reyes JAYSON Billing;  Location: [facility];  Service: Orthopedics;  Laterality: N/A;  Re do Decompression L3-L5  (X-Ray)    Central    Current Outpatient Medications  Medication Sig Dispense Refill Last Dose/Taking   Cholecalciferol (VITAMIN D ) 50 MCG (2000 UT) CAPS Take 2,000 Units by mouth in the morning.      dutasteride (AVODART) 0.5 MG capsule Take 0.5 mg by mouth in the morning.      lisinopril-hydrochlorothiazide (ZESTORETIC) 20-25 MG tablet Take 1 tablet by mouth in the morning.      Misc Natural Products (PROSTATE HEALTH PO) Take 1 capsule by mouth in the morning. Urinozinc Prostate Plus      tadalafil (CIALIS) 5 MG tablet Take 5 mg by mouth in the morning.      No current facility-administered medications for this visit.   No Known Allergies  Social History   Tobacco Use   Smoking status: Never    Passive exposure: Current (very minimal)   Smokeless tobacco: Never  Substance Use Topics   Alcohol  use: Yes    Alcohol /week: 2.0 standard drinks of alcohol     Types: 2 Cans of beer per week    Comment: occ    Family History  Problem Relation Age of Onset   Hypertension Mother  Hypertension Father    Colon cancer Neg Hx    Esophageal cancer Neg Hx    Rectal cancer Neg Hx    Stomach cancer Neg Hx      Review of Systems  Musculoskeletal:  Positive for arthralgias and gait problem.  All other systems reviewed and are negative.   Objective:  Physical Exam Constitutional:      Appearance: Normal appearance.  HENT:     Head: Normocephalic and atraumatic.     Nose: Nose normal.     Mouth/Throat:     Mouth: Mucous membranes are moist.     Pharynx: Oropharynx is  clear.  Eyes:     Conjunctiva/sclera: Conjunctivae normal.  Cardiovascular:     Rate and Rhythm: Normal rate and regular rhythm.     Pulses: Normal pulses.     Heart sounds: Normal heart sounds.  Pulmonary:     Effort: Pulmonary effort is normal.     Breath sounds: Normal breath sounds.  Abdominal:     General: Abdomen is flat.     Palpations: Abdomen is soft.  Genitourinary:    Comments: deferred Musculoskeletal:     Cervical back: Normal range of motion and neck supple.     Comments: Examination of the right hip reveals no skin wounds or lesions. There is no trochanteric tenderness to palpation. There is severely restricted range of motion of the right hip. The patient has a 25-degree flexion contracture and can flex up to 90 degrees. Internal rotation is 0 degrees, and external rotation is approximately 10 degrees. There is pain with flexion and rotation, and pain in the position of impingement. Positive Stinchfield test.  Palpable pedal pulses, no focal motor or sensory deficit. He ambulates with an antalgic gait.  Skin:    General: Skin is warm and dry.     Capillary Refill: Capillary refill takes less than 2 seconds.  Neurological:     General: No focal deficit present.     Mental Status: He is alert and oriented to person, place, and time.  Psychiatric:        Mood and Affect: Mood normal.        Behavior: Behavior normal.        Thought Content: Thought content normal.        Judgment: Judgment normal.     Vital signs in last 24 hours: @VSRANGES @  Labs:   Estimated body mass index is 27.83 kg/m as calculated from the following:   Height as of 11/19/22: 5' 8 (1.727 m).   Weight as of 11/19/22: 83 kg.   Imaging Review Plain radiographs demonstrate severe degenerative joint disease of the right hip(s). The bone quality appears to be adequate for age and reported activity level.      Assessment/Plan:  End stage arthritis, right hip(s)  The patient history,  physical examination, clinical judgement of the provider and imaging studies are consistent with end stage degenerative joint disease of the right hip(s) and total hip arthroplasty is deemed medically necessary. The treatment options including medical management, injection therapy, arthroscopy and arthroplasty were discussed at length. The risks and benefits of total hip arthroplasty were presented and reviewed. The risks due to aseptic loosening, infection, stiffness, dislocation/subluxation,  thromboembolic complications and other imponderables were discussed.  The patient acknowledged the explanation, agreed to proceed with the plan and consent was signed. Patient is being admitted for inpatient treatment for surgery, pain control, PT, OT, prophylactic antibiotics, VTE prophylaxis, progressive ambulation and ADL's and  discharge planning.The patient is planning to be discharged home with HEP after an overnight stay.   Therapy Plans: HEP.  Disposition: Home with wife Planned DVT Prophylaxis: Eliquis 2.5mg  BID DME needed: None. Has rolling walker or cane.  PCP: Cleared.  TXA: IV Allergies:  - NDKA.  Anesthesia Concerns: None.  BMI: 27.5.  Last HgbA1c: Not diabetic.  Other: - Patient wants SDS.  - PE history provoked COVID. Discussed Eliquis PO.  - Hydrocodone , zofran .  - Labs pending.    Patient's anticipated LOS is less than 2 midnights, meeting these requirements: - Younger than 89 - Lives within 1 hour of care - Has a competent adult at home to recover with post-op recover - NO history of  - Chronic pain requiring opiods  - Diabetes  - Coronary Artery Disease  - Heart failure  - Heart attack  - Stroke  - DVT/VTE  - Cardiac arrhythmia  - Respiratory Failure/COPD  - Renal failure  - Anemia  - Advanced Liver disease

## 2023-12-02 NOTE — H&P (View-Only) (Signed)
 TOTAL HIP ADMISSION H&P  Patient is admitted for right total hip arthroplasty.  Subjective:  Chief Complaint: right hip pain  HPI: Lance King, 73 y.o. male, has a history of pain and functional disability in the right hip(s) due to arthritis and patient has failed non-surgical conservative treatments for greater than 12 weeks to include NSAID's and/or analgesics, flexibility and strengthening excercises, use of assistive devices, and activity modification.  Onset of symptoms was gradual starting 10 years ago with rapidlly worsening course since that time.The patient noted no past surgery on the right hip(s).  Patient currently rates pain in the right hip at 10 out of 10 with activity. Patient has night pain, worsening of pain with activity and weight bearing, trendelenberg gait, pain that interfers with activities of daily living, and pain with passive range of motion. Patient has evidence of subchondral cysts, subchondral sclerosis, periarticular osteophytes, and joint space narrowing by imaging studies. This condition presents safety issues increasing the risk of falls.  There is no current active infection.  Patient Active Problem List   Diagnosis Date Noted   Paget's disease of bone 04/20/2018   MGUS (monoclonal gammopathy of unknown significance) 04/20/2018   Vitamin D  deficiency 04/20/2018   DDD (degenerative disc disease), lumbar 04/06/2018   Primary osteoarthritis of right knee 04/06/2018   Adhesive capsulitis of right shoulder 04/06/2018   Essential hypertension 04/06/2018   Past Medical History:  Diagnosis Date   Complication of anesthesia    states a smaller tube has been used in the past due to sore throat   Elevated PSA    Hypertension    EKG PCP 02/05/11 on chart   Internal hemorrhoids    MGUS (monoclonal gammopathy of unknown significance)    Paget disease of bone    Pneumonia    Tubular adenoma of colon    Vitamin D  deficiency     Past Surgical History:   Procedure Laterality Date   BACK SURGERY     Lumbar decompression 1994   COLONOSCOPY  01/09/2017   Colonic polyps status post polypectomy. Small internal hemorrhoids   KNEE ARTHROSCOPY     1987   LUMBAR LAMINECTOMY/DECOMPRESSION MICRODISCECTOMY  03/05/2011   Procedure: LUMBAR LAMINECTOMY/DECOMPRESSION MICRODISCECTOMY;  Surgeon: Reyes JAYSON Billing;  Location: [facility];  Service: Orthopedics;  Laterality: N/A;  Re do Decompression L3-L5  (X-Ray)    Central    Current Outpatient Medications  Medication Sig Dispense Refill Last Dose/Taking   Cholecalciferol (VITAMIN D ) 50 MCG (2000 UT) CAPS Take 2,000 Units by mouth in the morning.      dutasteride  (AVODART ) 0.5 MG capsule Take 0.5 mg by mouth in the morning.      lisinopril-hydrochlorothiazide (ZESTORETIC) 20-25 MG tablet Take 1 tablet by mouth in the morning.      Misc Natural Products (PROSTATE HEALTH PO) Take 1 capsule by mouth in the morning. Urinozinc Prostate Plus      tadalafil  (CIALIS ) 5 MG tablet Take 5 mg by mouth in the morning.      No current facility-administered medications for this visit.   No Known Allergies  Social History   Tobacco Use   Smoking status: Never    Passive exposure: Current (very minimal)   Smokeless tobacco: Never  Substance Use Topics   Alcohol  use: Yes    Alcohol /week: 4.0 standard drinks of alcohol     Types: 4 Cans of beer per week    Comment: occ    Family History  Problem Relation Age of Onset  Hypertension Mother    Hypertension Father    Colon cancer Neg Hx    Esophageal cancer Neg Hx    Rectal cancer Neg Hx    Stomach cancer Neg Hx      Review of Systems  Musculoskeletal:  Positive for arthralgias and gait problem.  All other systems reviewed and are negative.   Objective:  Physical Exam Constitutional:      Appearance: Normal appearance.  HENT:     Head: Normocephalic and atraumatic.     Nose: Nose normal.     Mouth/Throat:     Mouth: Mucous membranes are moist.      Pharynx: Oropharynx is clear.  Eyes:     Conjunctiva/sclera: Conjunctivae normal.  Cardiovascular:     Rate and Rhythm: Normal rate and regular rhythm.     Pulses: Normal pulses.     Heart sounds: Normal heart sounds.  Pulmonary:     Effort: Pulmonary effort is normal.     Breath sounds: Normal breath sounds.  Abdominal:     General: Abdomen is flat.     Palpations: Abdomen is soft.  Genitourinary:    Comments: deferred Musculoskeletal:     Cervical back: Normal range of motion and neck supple.     Comments: Examination of the right hip reveals no skin wounds or lesions. There is no trochanteric tenderness to palpation. There is severely restricted range of motion of the right hip. The patient has a 25-degree flexion contracture and can flex up to 90 degrees. Internal rotation is 0 degrees, and external rotation is approximately 10 degrees. There is pain with flexion and rotation, and pain in the position of impingement. Positive Stinchfield test.  Palpable pedal pulses, no focal motor or sensory deficit. He ambulates with an antalgic gait.  Skin:    General: Skin is warm and dry.     Capillary Refill: Capillary refill takes less than 2 seconds.  Neurological:     General: No focal deficit present.     Mental Status: He is alert and oriented to person, place, and time.  Psychiatric:        Mood and Affect: Mood normal.        Behavior: Behavior normal.        Thought Content: Thought content normal.        Judgment: Judgment normal.     Vital signs in last 24 hours: @VSRANGES @  Labs:   Estimated body mass index is 26.91 kg/m as calculated from the following:   Height as of 12/09/23: 5' 8 (1.727 m).   Weight as of 12/09/23: 80.3 kg.   Imaging Review Plain radiographs demonstrate severe degenerative joint disease of the right hip(s). The bone quality appears to be adequate for age and reported activity level.      Assessment/Plan:  End stage arthritis, right  hip(s)  The patient history, physical examination, clinical judgement of the provider and imaging studies are consistent with end stage degenerative joint disease of the right hip(s) and total hip arthroplasty is deemed medically necessary. The treatment options including medical management, injection therapy, arthroscopy and arthroplasty were discussed at length. The risks and benefits of total hip arthroplasty were presented and reviewed. The risks due to aseptic loosening, infection, stiffness, dislocation/subluxation,  thromboembolic complications and other imponderables were discussed.  The patient acknowledged the explanation, agreed to proceed with the plan and consent was signed. Patient is being admitted for inpatient treatment for surgery, pain control, PT, OT, prophylactic antibiotics, VTE prophylaxis,  progressive ambulation and ADL's and discharge planning.The patient is planning to be discharged home with HEP after an overnight stay.   Therapy Plans: HEP.  Disposition: Home with wife Planned DVT Prophylaxis: Eliquis  2.5mg  BID DME needed: None. Has rolling walker or cane.  PCP: Cleared.  TXA: IV Allergies:  - NDKA.  Anesthesia Concerns: None.  BMI: 27.5.  Last HgbA1c: Not diabetic.  Other: - Patient wants SDS.  - PE history provoked COVID. Discussed Eliquis  PO.  - Hydrocodone , zofran .  - 12/09/23: Hgb 16.2, K+ 3.9, Cr. 1.04.    Patient's anticipated LOS is less than 2 midnights, meeting these requirements: - Younger than 34 - Lives within 1 hour of care - Has a competent adult at home to recover with post-op recover - NO history of  - Chronic pain requiring opiods  - Diabetes  - Coronary Artery Disease  - Heart failure  - Heart attack  - Stroke  - DVT/VTE  - Cardiac arrhythmia  - Respiratory Failure/COPD  - Renal failure  - Anemia  - Advanced Liver disease

## 2023-12-08 NOTE — Patient Instructions (Signed)
 SURGICAL WAITING ROOM VISITATION  Patients having surgery or a procedure may have no more than 2 support people in the waiting area - these visitors may rotate.    Children under the age of 27 must have an adult with them who is not the patient.  Visitors with respiratory illnesses are discouraged from visiting and should remain at home.  If the patient needs to stay at the hospital during part of their recovery, the visitor guidelines for inpatient rooms apply. Pre-op nurse will coordinate an appropriate time for 1 support person to accompany patient in pre-op.  This support person may not rotate.    Please refer to the [facility] website for the visitor guidelines for Inpatients (after your surgery is over and you are in a regular room).    Your procedure is scheduled on: 12/16/23   Report to [facility] Main Entrance    Report to admitting at 6:00 AM   Call this number if you have problems the morning of surgery [phone]   Do not eat food :After Midnight.   After Midnight you may have the following liquids until 5:30 AM DAY OF SURGERY  Water Non-Citrus Juices (without pulp, NO RED-Apple, White grape, White cranberry) Black Coffee (NO MILK/CREAM OR CREAMERS, sugar ok)  Clear Tea (NO MILK/CREAM OR CREAMERS, sugar ok) regular and decaf                             Plain Jell-O (NO RED)                                           Fruit ices (not with fruit pulp, NO RED)                                     Popsicles (NO RED)                                                               Sports drinks like Gatorade (NO RED)   The day of surgery:  Drink ONE (1) Pre-Surgery Clear Ensure at 5:30 AM the morning of surgery. Drink in one sitting. Do not sip.  This drink was given to you during your hospital  pre-op appointment visit. Nothing else to drink after completing the  Pre-Surgery Clear Ensure.          If you have questions, please contact your surgeon's  office.   FOLLOW BOWEL PREP AND ANY ADDITIONAL PRE OP INSTRUCTIONS YOU RECEIVED FROM YOUR SURGEON'S OFFICE!!!     Oral Hygiene is also important to reduce your risk of infection.                                    Remember - BRUSH YOUR TEETH THE MORNING OF SURGERY WITH YOUR REGULAR TOOTHPASTE  DENTURES WILL BE REMOVED PRIOR TO SURGERY PLEASE DO NOT APPLY Poly grip OR ADHESIVES!!!   Stop all vitamins and herbal supplements 7 days before surgery.  Take these medicines the morning of surgery with A SIP OF WATER: None                               You may not have any metal on your body including hair pins, jewelry, and body piercing             Do not wear lotions, powders, cologne, or deodorant              Men may shave face and neck.   Do not bring valuables to the hospital. [place] IS NOT             RESPONSIBLE   FOR VALUABLES.   Contacts, glasses, dentures or bridgework may not be worn into surgery.   Bring small overnight bag day of surgery.   DO NOT BRING YOUR HOME MEDICATIONS TO THE HOSPITAL. PHARMACY WILL DISPENSE MEDICATIONS LISTED ON YOUR MEDICATION LIST TO YOU DURING YOUR ADMISSION IN THE HOSPITAL!    Patients discharged on the day of surgery will not be allowed to drive home.  Someone NEEDS to stay with you for the first 24 hours after anesthesia.              Please read over the following fact sheets you were given: IF YOU HAVE QUESTIONS ABOUT YOUR PRE-OP INSTRUCTIONS PLEASE CALL [phone] [place].   If you received a COVID test during your pre-op visit  it is requested that you wear a mask when out in public, stay away from anyone that may not be feeling well and notify your surgeon if you develop symptoms. If you test positive for Covid or have been in contact with anyone that has tested positive in the last 10 days please notify you surgeon.      Pre-operative 5 CHG Bath Instructions   You can play a key role in reducing the risk of infection after  surgery. Your skin needs to be as free of germs as possible. You can reduce the number of germs on your skin by washing with CHG (chlorhexidine  gluconate) soap before surgery. CHG is an antiseptic soap that kills germs and continues to kill germs even after washing.   DO NOT use if you have an allergy to chlorhexidine /CHG or antibacterial soaps. If your skin becomes reddened or irritated, stop using the CHG and notify one of our RNs at [phone].   Please shower with the CHG soap starting 4 days before surgery using the following schedule:     Please keep in mind the following:  DO NOT shave, including legs and underarms, starting the day of your first shower.   You may shave your face at any point before/day of surgery.  Place clean sheets on your bed the day you start using CHG soap. Use a clean washcloth (not used since being washed) for each shower. DO NOT sleep with pets once you start using the CHG.   CHG Shower Instructions:  If you choose to wash your hair and private area, wash first with your normal shampoo/soap.  After you use shampoo/soap, rinse your hair and body thoroughly to remove shampoo/soap residue.  Turn the water OFF and apply about 3 tablespoons (45 ml) of CHG soap to a CLEAN washcloth.  Apply CHG soap ONLY FROM YOUR NECK DOWN TO YOUR TOES (washing for 3-5 minutes)  DO NOT use CHG soap on face, private areas, open wounds, or sores.  Pay  special attention to the area where your surgery is being performed.  If you are having back surgery, having someone wash your back for you may be helpful. Wait 2 minutes after CHG soap is applied, then you may rinse off the CHG soap.  Pat dry with a clean towel  Put on clean clothes/pajamas   If you choose to wear lotion, please use ONLY the CHG-compatible lotions on the back of this paper.     Additional instructions for the day of surgery: DO NOT APPLY any lotions, deodorants, cologne, or perfumes.   Put on clean/comfortable  clothes.  Brush your teeth.  Ask your nurse before applying any prescription medications to the skin.      CHG Compatible Lotions   Aveeno Moisturizing lotion  Cetaphil Moisturizing Cream  Cetaphil Moisturizing Lotion  Clairol Herbal Essence Moisturizing Lotion, Dry Skin  Clairol Herbal Essence Moisturizing Lotion, Extra Dry Skin  Clairol Herbal Essence Moisturizing Lotion, Normal Skin  Curel Age Defying Therapeutic Moisturizing Lotion with Alpha Hydroxy  Curel Extreme Care Body Lotion  Curel Soothing Hands Moisturizing Hand Lotion  Curel Therapeutic Moisturizing Cream, Fragrance-Free  Curel Therapeutic Moisturizing Lotion, Fragrance-Free  Curel Therapeutic Moisturizing Lotion, Original Formula  Eucerin Daily Replenishing Lotion  Eucerin Dry Skin Therapy Plus Alpha Hydroxy Crme  Eucerin Dry Skin Therapy Plus Alpha Hydroxy Lotion  Eucerin Original Crme  Eucerin Original Lotion  Eucerin Plus Crme Eucerin Plus Lotion  Eucerin TriLipid Replenishing Lotion  Keri Anti-Bacterial Hand Lotion  Keri Deep Conditioning Original Lotion Dry Skin Formula Softly Scented  Keri Deep Conditioning Original Lotion, Fragrance Free Sensitive Skin Formula  Keri Lotion Fast Absorbing Fragrance Free Sensitive Skin Formula  Keri Lotion Fast Absorbing Softly Scented Dry Skin Formula  Keri Original Lotion  Keri Skin Renewal Lotion Keri Silky Smooth Lotion  Keri Silky Smooth Sensitive Skin Lotion  Nivea Body Creamy Conditioning Oil  Nivea Body Extra Enriched Lotion  Nivea Body Original Lotion  Nivea Body Sheer Moisturizing Lotion Nivea Crme  Nivea Skin Firming Lotion  NutraDerm 30 Skin Lotion  NutraDerm Skin Lotion  NutraDerm Therapeutic Skin Cream  NutraDerm Therapeutic Skin Lotion  ProShield Protective Hand Cream  Provon moisturizing lotion   Incentive Spirometer  An incentive spirometer is a tool that can help keep your lungs clear and active. This tool measures how well you are filling  your lungs with each breath. Taking long deep breaths may help reverse or decrease the chance of developing breathing (pulmonary) problems (especially infection) following: A long period of time when you are unable to move or be active. BEFORE THE PROCEDURE  If the spirometer includes an indicator to show your best effort, your nurse or respiratory therapist will set it to a desired goal. If possible, sit up straight or lean slightly forward. Try not to slouch. Hold the incentive spirometer in an upright position. INSTRUCTIONS FOR USE  Sit on the edge of your bed if possible, or sit up as far as you can in bed or on a chair. Hold the incentive spirometer in an upright position. Breathe out normally. Place the mouthpiece in your mouth and seal your lips tightly around it. Breathe in slowly and as deeply as possible, raising the piston or the ball toward the top of the column. Hold your breath for 3-5 seconds or for as long as possible. Allow the piston or ball to fall to the bottom of the column. Remove the mouthpiece from your mouth and breathe out normally. Rest for a few  seconds and repeat Steps 1 through 7 at least 10 times every 1-2 hours when you are awake. Take your time and take a few normal breaths between deep breaths. The spirometer may include an indicator to show your best effort. Use the indicator as a goal to work toward during each repetition. After each set of 10 deep breaths, practice coughing to be sure your lungs are clear. If you have an incision (the cut made at the time of surgery), support your incision when coughing by placing a pillow or rolled up towels firmly against it. Once you are able to get out of bed, walk around indoors and cough well. You may stop using the incentive spirometer when instructed by your caregiver.  RISKS AND COMPLICATIONS Take your time so you do not get dizzy or light-headed. If you are in pain, you may need to take or ask for pain medication  before doing incentive spirometry. It is harder to take a deep breath if you are having pain. AFTER USE Rest and breathe slowly and easily. It can be helpful to keep track of a log of your progress. Your caregiver can provide you with a simple table to help with this. If you are using the spirometer at home, follow these instructions: SEEK MEDICAL CARE IF:  You are having difficultly using the spirometer. You have trouble using the spirometer as often as instructed. Your pain medication is not giving enough relief while using the spirometer. You develop fever of 100.5 F (38.1 C) or higher. SEEK IMMEDIATE MEDICAL CARE IF:  You cough up bloody sputum that had not been present before. You develop fever of 102 F (38.9 C) or greater. You develop worsening pain at or near the incision site. MAKE SURE YOU:  Understand these instructions. Will watch your condition. Will get help right away if you are not doing well or get worse. Document Released: 07/28/2006 Document Revised: 06/09/2011 Document Reviewed: 09/28/2006 Exit[employer] Patient Information 2014 [employer], [place].   ________________________________________________________________________ WHAT IS A BLOOD TRANSFUSION? Blood Transfusion Information  A transfusion is the replacement of blood or some of its parts. Blood is made up of multiple cells which provide different functions. Red blood cells carry oxygen and are used for blood loss replacement. White blood cells fight against infection. Platelets control bleeding. Plasma helps clot blood. Other blood products are available for specialized needs, such as hemophilia or other clotting disorders. BEFORE THE TRANSFUSION  Who gives blood for transfusions?  Healthy volunteers who are fully evaluated to make sure their blood is safe. This is blood bank blood. Transfusion therapy is the safest it has ever been in the practice of medicine. Before blood is taken from a donor, a complete  history is taken to make sure that person has no history of diseases nor engages in risky social behavior (examples are intravenous drug use or sexual activity with multiple partners). The donor's travel history is screened to minimize risk of transmitting infections, such as malaria. The donated blood is tested for signs of infectious diseases, such as HIV and hepatitis. The blood is then tested to be sure it is compatible with you in order to minimize the chance of a transfusion reaction. If you or a relative donates blood, this is often done in anticipation of surgery and is not appropriate for emergency situations. It takes many days to process the donated blood. RISKS AND COMPLICATIONS Although transfusion therapy is very safe and saves many lives, the main dangers of transfusion include:  Getting an infectious disease. Developing a transfusion reaction. This is an allergic reaction to something in the blood you were given. Every precaution is taken to prevent this. The decision to have a blood transfusion has been considered carefully by your caregiver before blood is given. Blood is not given unless the benefits outweigh the risks. AFTER THE TRANSFUSION Right after receiving a blood transfusion, you will usually feel much better and more energetic. This is especially true if your red blood cells have gotten low (anemic). The transfusion raises the level of the red blood cells which carry oxygen, and this usually causes an energy increase. The nurse administering the transfusion will monitor you carefully for complications. HOME CARE INSTRUCTIONS  No special instructions are needed after a transfusion. You may find your energy is better. Speak with your caregiver about any limitations on activity for underlying diseases you may have. SEEK MEDICAL CARE IF:  Your condition is not improving after your transfusion. You develop redness or irritation at the intravenous (IV) site. SEEK IMMEDIATE MEDICAL  CARE IF:  Any of the following symptoms occur over the next 12 hours: Shaking chills. You have a temperature by mouth above 102 F (38.9 C), not controlled by medicine. Chest, back, or muscle pain. People around you feel you are not acting correctly or are confused. Shortness of breath or difficulty breathing. Dizziness and fainting. You get a rash or develop hives. You have a decrease in urine output. Your urine turns a dark color or changes to pink, red, or brown. Any of the following symptoms occur over the next 10 days: You have a temperature by mouth above 102 F (38.9 C), not controlled by medicine. Shortness of breath. Weakness after normal activity. The white part of the eye turns yellow (jaundice). You have a decrease in the amount of urine or are urinating less often. Your urine turns a dark color or changes to pink, red, or brown. Document Released: 03/14/2000 Document Revised: 06/09/2011 Document Reviewed: 11/01/2007 [facility] Patient Information 2014 [place], [place].  _______________________________________________________________________

## 2023-12-08 NOTE — Progress Notes (Signed)
 COVID Vaccine Completed: yes  Date of COVID positive in last 90 days:  PCP - Eric Georgi, MD Cardiologist -   Chest x-ray - N/A EKG - N/A Stress Test - N/A ECHO - N/A Cardiac Cath -  Pacemaker/ICD device last checked:N/A Spinal Cord Stimulator:N/A  Bowel Prep - N/A  Sleep Study - N/A CPAP -   Fasting Blood Sugar - N/A Checks Blood Sugar _____ times a day  Last dose of GLP1 agonist-  N/A GLP1 instructions:  Do not take after     Last dose of SGLT-2 inhibitors-  N/A SGLT-2 instructions:  Do not take after     Blood Thinner Instructions: N/A Last dose:   Time: Aspirin Instructions:N/A Last Dose:  Activity level:  Can go up a flight of stairs and perform activities of daily living without stopping and without symptoms of chest pain or shortness of breath.  Able to exercise without symptoms  Unable to go up a flight of stairs without symptoms of     Anesthesia review: N/A  Patient denies shortness of breath, fever, cough and chest pain at PAT appointment  Patient verbalized understanding of instructions that were given to them at the PAT appointment. Patient was also instructed that they will need to review over the PAT instructions again at home before surgery.

## 2023-12-09 ENCOUNTER — Other Ambulatory Visit: Payer: Self-pay

## 2023-12-09 ENCOUNTER — Encounter (HOSPITAL_COMMUNITY): Payer: Self-pay

## 2023-12-09 ENCOUNTER — Encounter (HOSPITAL_COMMUNITY)
Admission: RE | Admit: 2023-12-09 | Discharge: 2023-12-09 | Disposition: A | Discharge status: Home / Self Care | Source: Ambulatory Visit | Attending: Orthopedic Surgery | Admitting: Orthopedic Surgery

## 2023-12-09 VITALS — BP 116/81 | HR 77 | Temp 97.7°F | Resp 16 | Ht 68.0 in | Wt 177.0 lb

## 2023-12-09 DIAGNOSIS — I10 Essential (primary) hypertension: Secondary | ICD-10-CM | POA: Diagnosis not present

## 2023-12-09 DIAGNOSIS — Z01818 Encounter for other preprocedural examination: Secondary | ICD-10-CM | POA: Insufficient documentation

## 2023-12-09 HISTORY — DX: Pneumonia, unspecified organism: J18.9

## 2023-12-09 LAB — COMPREHENSIVE METABOLIC PANEL WITH GFR
ALT: 15 U/L (ref 0–44)
AST: 22 U/L (ref 15–41)
Albumin: 4.4 g/dL (ref 3.5–5.0)
Alkaline Phosphatase: 57 U/L (ref 38–126)
Anion gap: 12 (ref 5–15)
BUN: 15 mg/dL (ref 8–23)
CO2: 24 mmol/L (ref 22–32)
Calcium: 9.4 mg/dL (ref 8.9–10.3)
Chloride: 103 mmol/L (ref 98–111)
Creatinine, Ser: 1.04 mg/dL (ref 0.61–1.24)
GFR, Estimated: >60 mL/min (ref >60)
Glucose, Bld: 74 mg/dL (ref 70–99)
Potassium: 3.9 mmol/L (ref 3.5–5.1)
Sodium: 139 mmol/L (ref 135–145)
Total Bilirubin: 1.6 mg/dL — ABNORMAL HIGH (ref 0.0–1.2)
Total Protein: 6.6 g/dL (ref 6.5–8.1)

## 2023-12-09 LAB — CBC
HCT: 47.8 % (ref 39.0–52.0)
Hemoglobin: 15.2 g/dL (ref 13.0–17.0)
MCH: 26.6 pg (ref 26.0–34.0)
MCHC: 31.8 g/dL (ref 30.0–36.0)
MCV: 83.6 fL (ref 80.0–100.0)
Platelets: 287 K/uL (ref 150–400)
RBC: 5.72 MIL/uL (ref 4.22–5.81)
RDW: 14 % (ref 11.5–15.5)
WBC: 6.4 K/uL (ref 4.0–10.5)
nRBC: 0 % (ref 0.0–0.2)

## 2023-12-09 LAB — SURGICAL PCR SCREEN
MRSA, PCR: NEGATIVE
Staphylococcus aureus: NEGATIVE

## 2023-12-15 NOTE — Anesthesia Preprocedure Evaluation (Addendum)
 Anesthesia Evaluation  Patient identified by MRN, date of birth, ID band Patient awake    Reviewed: Allergy & Precautions, NPO status , Patient's Chart, lab work & pertinent test results  Airway Mallampati: II  TM Distance: >3 FB Neck ROM: Full    Dental  (+) Dental Advisory Given, Chipped   Pulmonary pneumonia   Pulmonary exam normal breath sounds clear to auscultation       Cardiovascular hypertension, Pt. on medications Normal cardiovascular exam Rhythm:Regular Rate:Normal  Echo 2022 EF 55-60%, mild MR and TR   Neuro/Psych negative neurological ROS     GI/Hepatic negative GI ROS, Neg liver ROS,,,  Endo/Other  negative endocrine ROS    Renal/GU negative Renal ROS     Musculoskeletal  (+) Arthritis ,    Abdominal   Peds  Hematology negative hematology ROS (+)   Anesthesia Other Findings   Reproductive/Obstetrics                              Anesthesia Physical Anesthesia Plan  ASA: 2  Anesthesia Plan: Spinal   Post-op Pain Management: Tylenol  PO (pre-op)*   Induction:   PONV Risk Score and Plan: 2 and Ondansetron , Dexamethasone , Treatment may vary due to age or medical condition, Propofol  infusion and TIVA  Airway Management Planned: Natural Airway  Additional Equipment:   Intra-op Plan:   Post-operative Plan:   Informed Consent: I have reviewed the patients History and Physical, chart, labs and discussed the procedure including the risks, benefits and alternatives for the proposed anesthesia with the patient or authorized representative who has indicated his/her understanding and acceptance.     Dental advisory given  Plan Discussed with: CRNA  Anesthesia Plan Comments:          Anesthesia Quick Evaluation

## 2023-12-16 ENCOUNTER — Ambulatory Visit (HOSPITAL_BASED_OUTPATIENT_CLINIC_OR_DEPARTMENT_OTHER): Payer: Self-pay | Admitting: Anesthesiology

## 2023-12-16 ENCOUNTER — Other Ambulatory Visit: Payer: Self-pay

## 2023-12-16 ENCOUNTER — Encounter (HOSPITAL_COMMUNITY)
Admission: RE | Disposition: A | Discharge status: Home / Self Care | Payer: Self-pay | Source: Ambulatory Visit | Attending: Orthopedic Surgery

## 2023-12-16 ENCOUNTER — Encounter (HOSPITAL_COMMUNITY): Payer: Self-pay | Admitting: Orthopedic Surgery

## 2023-12-16 ENCOUNTER — Ambulatory Visit (HOSPITAL_COMMUNITY): Payer: Self-pay | Admitting: Anesthesiology

## 2023-12-16 ENCOUNTER — Ambulatory Visit (HOSPITAL_COMMUNITY)
Admission: RE | Admit: 2023-12-16 | Discharge: 2023-12-17 | Disposition: A | Discharge status: Home / Self Care | Source: Ambulatory Visit | Attending: Orthopedic Surgery | Admitting: Orthopedic Surgery

## 2023-12-16 ENCOUNTER — Ambulatory Visit (HOSPITAL_COMMUNITY)

## 2023-12-16 DIAGNOSIS — M1612 Unilateral primary osteoarthritis, left hip: Secondary | ICD-10-CM | POA: Diagnosis not present

## 2023-12-16 DIAGNOSIS — Z96641 Presence of right artificial hip joint: Secondary | ICD-10-CM | POA: Diagnosis not present

## 2023-12-16 DIAGNOSIS — M1611 Unilateral primary osteoarthritis, right hip: Secondary | ICD-10-CM | POA: Insufficient documentation

## 2023-12-16 DIAGNOSIS — Z86711 Personal history of pulmonary embolism: Secondary | ICD-10-CM | POA: Diagnosis not present

## 2023-12-16 DIAGNOSIS — I10 Essential (primary) hypertension: Secondary | ICD-10-CM | POA: Insufficient documentation

## 2023-12-16 DIAGNOSIS — Z471 Aftercare following joint replacement surgery: Secondary | ICD-10-CM | POA: Diagnosis not present

## 2023-12-16 HISTORY — PX: TOTAL HIP ARTHROPLASTY: SHX124

## 2023-12-16 LAB — TYPE AND SCREEN
ABO/RH(D): O NEG
Antibody Screen: NEGATIVE

## 2023-12-16 SURGERY — ARTHROPLASTY, HIP, TOTAL, ANTERIOR APPROACH
Anesthesia: Spinal | Site: Hip | Laterality: Right

## 2023-12-16 MED ORDER — BUPIVACAINE-EPINEPHRINE (PF) 0.25% -1:200000 IJ SOLN
INTRAMUSCULAR | Status: AC
  Filled 2023-12-16: qty 30

## 2023-12-16 MED ORDER — LIDOCAINE HCL (PF) 2 % IJ SOLN
INTRAMUSCULAR | Status: AC
  Filled 2023-12-16: qty 5

## 2023-12-16 MED ORDER — ACETAMINOPHEN 500 MG PO TABS
1000.0000 mg | ORAL_TABLET | Freq: Once | ORAL | Status: DC
Start: 2023-12-16 — End: 2023-12-16

## 2023-12-16 MED ORDER — HYDROMORPHONE HCL 1 MG/ML IJ SOLN: 0.2500 mg | INTRAMUSCULAR | Status: DC | PRN

## 2023-12-16 MED ORDER — LACTATED RINGERS IV SOLN: INTRAVENOUS | Status: DC

## 2023-12-16 MED ORDER — METHOCARBAMOL 1000 MG/10ML IJ SOLN: 500.0000 mg | Freq: Four times a day (QID) | INTRAMUSCULAR | Status: DC | PRN

## 2023-12-16 MED ORDER — ALBUMIN HUMAN 5 % IV SOLN
INTRAVENOUS | Status: AC
  Filled 2023-12-16: qty 250

## 2023-12-16 MED ORDER — PROPOFOL 10 MG/ML IV BOLUS
INTRAVENOUS | Status: DC | PRN
  Administered 2023-12-16 (×2): 20 mg via INTRAVENOUS

## 2023-12-16 MED ORDER — FENTANYL CITRATE (PF) 250 MCG/5ML IJ SOLN
INTRAMUSCULAR | Status: DC | PRN
  Administered 2023-12-16 (×2): 50 ug via INTRAVENOUS

## 2023-12-16 MED ORDER — ALUM & MAG HYDROXIDE-SIMETH 200-200-20 MG/5ML PO SUSP: 30.0000 mL | ORAL | Status: DC | PRN

## 2023-12-16 MED ORDER — MORPHINE SULFATE (PF) 2 MG/ML IV SOLN: 0.5000 mg | INTRAVENOUS | Status: DC | PRN

## 2023-12-16 MED ORDER — PHENYLEPHRINE HCL (PRESSORS) 10 MG/ML IV SOLN
INTRAVENOUS | Status: AC
Start: 2023-12-16 — End: 2023-12-16
  Filled 2023-12-16: qty 1

## 2023-12-16 MED ORDER — KETOROLAC TROMETHAMINE 15 MG/ML IJ SOLN
7.5000 mg | Freq: Four times a day (QID) | INTRAMUSCULAR | Status: AC
Start: 2023-12-16 — End: 2023-12-17
  Administered 2023-12-16 – 2023-12-17 (×3): 7.5 mg via INTRAVENOUS
  Filled 2023-12-16 (×3): qty 1

## 2023-12-16 MED ORDER — ORAL CARE MOUTH RINSE: 15.0000 mL | Freq: Once | OROMUCOSAL | Status: AC

## 2023-12-16 MED ORDER — PROPOFOL 500 MG/50ML IV EMUL
INTRAVENOUS | Status: DC | PRN
  Administered 2023-12-16: 50 ug/kg/min via INTRAVENOUS
  Administered 2023-12-16: 100 ug/kg/min via INTRAVENOUS

## 2023-12-16 MED ORDER — PROPOFOL 10 MG/ML IV BOLUS
INTRAVENOUS | Status: AC
Start: 2023-12-16 — End: 2023-12-16
  Filled 2023-12-16: qty 20

## 2023-12-16 MED ORDER — HYDROCODONE-ACETAMINOPHEN 5-325 MG PO TABS: 1.0000 | ORAL_TABLET | ORAL | Status: DC | PRN

## 2023-12-16 MED ORDER — ACETAMINOPHEN 500 MG PO TABS
1000.0000 mg | ORAL_TABLET | Freq: Once | ORAL | Status: AC
Start: 2023-12-16 — End: 2023-12-16
  Administered 2023-12-16: 1000 mg via ORAL
  Filled 2023-12-16: qty 2

## 2023-12-16 MED ORDER — FENTANYL CITRATE (PF) 100 MCG/2ML IJ SOLN
INTRAMUSCULAR | Status: AC
  Filled 2023-12-16: qty 2

## 2023-12-16 MED ORDER — TRANEXAMIC ACID-NACL 1000-0.7 MG/100ML-% IV SOLN
1000.0000 mg | INTRAVENOUS | Status: AC
Start: 2023-12-16 — End: 2023-12-16
  Administered 2023-12-16: 1000 mg via INTRAVENOUS
  Filled 2023-12-16: qty 100

## 2023-12-16 MED ORDER — POVIDONE-IODINE 10 % EX SWAB
2.0000 | Freq: Once | CUTANEOUS | Status: AC
Start: 2023-12-16 — End: 2023-12-16
  Administered 2023-12-16: 2 via TOPICAL

## 2023-12-16 MED ORDER — CEFAZOLIN SODIUM-DEXTROSE 2-4 GM/100ML-% IV SOLN
2.0000 g | Freq: Four times a day (QID) | INTRAVENOUS | Status: AC
  Administered 2023-12-16 (×2): 2 g via INTRAVENOUS
  Filled 2023-12-16 (×2): qty 100

## 2023-12-16 MED ORDER — HYDROCODONE-ACETAMINOPHEN 7.5-325 MG PO TABS: 1.0000 | ORAL_TABLET | ORAL | Status: DC | PRN

## 2023-12-16 MED ORDER — PANTOPRAZOLE SODIUM 40 MG PO TBEC
40.0000 mg | DELAYED_RELEASE_TABLET | Freq: Every day | ORAL | Status: DC
  Administered 2023-12-16 – 2023-12-17 (×2): 40 mg via ORAL
  Filled 2023-12-16 (×2): qty 1

## 2023-12-16 MED ORDER — SODIUM CHLORIDE 0.9 % IV SOLN: INTRAVENOUS | Status: DC

## 2023-12-16 MED ORDER — DIPHENHYDRAMINE HCL 12.5 MG/5ML PO ELIX: 12.5000 mg | ORAL_SOLUTION | ORAL | Status: DC | PRN

## 2023-12-16 MED ORDER — METHOCARBAMOL 500 MG PO TABS: 500.0000 mg | ORAL_TABLET | Freq: Four times a day (QID) | ORAL | Status: DC | PRN

## 2023-12-16 MED ORDER — POLYETHYLENE GLYCOL 3350 17 G PO PACK: 17.0000 g | PACK | Freq: Every day | ORAL | Status: DC | PRN

## 2023-12-16 MED ORDER — SODIUM CHLORIDE (PF) 0.9 % IJ SOLN
INTRAMUSCULAR | Status: AC
  Filled 2023-12-16: qty 50

## 2023-12-16 MED ORDER — ONDANSETRON HCL 4 MG/2ML IJ SOLN: 4.0000 mg | Freq: Four times a day (QID) | INTRAMUSCULAR | Status: DC | PRN

## 2023-12-16 MED ORDER — DROPERIDOL 2.5 MG/ML IJ SOLN: 0.6250 mg | Freq: Once | INTRAMUSCULAR | Status: DC | PRN

## 2023-12-16 MED ORDER — BISACODYL 10 MG RE SUPP: 10.0000 mg | Freq: Every day | RECTAL | Status: DC | PRN

## 2023-12-16 MED ORDER — SENNA 8.6 MG PO TABS
1.0000 | ORAL_TABLET | Freq: Two times a day (BID) | ORAL | Status: DC
  Administered 2023-12-16 – 2023-12-17 (×3): 8.6 mg via ORAL
  Filled 2023-12-16 (×3): qty 1

## 2023-12-16 MED ORDER — APIXABAN 2.5 MG PO TABS
2.5000 mg | ORAL_TABLET | Freq: Two times a day (BID) | ORAL | Status: DC
  Administered 2023-12-17: 2.5 mg via ORAL
  Filled 2023-12-16: qty 1

## 2023-12-16 MED ORDER — MENTHOL 3 MG MT LOZG: 1.0000 | LOZENGE | OROMUCOSAL | Status: DC | PRN

## 2023-12-16 MED ORDER — METOCLOPRAMIDE HCL 5 MG/ML IJ SOLN: 5.0000 mg | Freq: Three times a day (TID) | INTRAMUSCULAR | Status: DC | PRN

## 2023-12-16 MED ORDER — CHLORHEXIDINE GLUCONATE 0.12 % MT SOLN
15.0000 mL | Freq: Once | OROMUCOSAL | Status: AC
  Administered 2023-12-16: 15 mL via OROMUCOSAL

## 2023-12-16 MED ORDER — PRONTOSAN WOUND IRRIGATION OPTIME
TOPICAL | Status: DC | PRN
Start: 2023-12-16 — End: 2023-12-16
  Administered 2023-12-16: 1 via TOPICAL

## 2023-12-16 MED ORDER — POVIDONE-IODINE 10 % EX SWAB: 2.0000 | Freq: Once | CUTANEOUS | Status: DC

## 2023-12-16 MED ORDER — EPHEDRINE SULFATE-NACL 50-0.9 MG/10ML-% IV SOSY
PREFILLED_SYRINGE | INTRAVENOUS | Status: DC | PRN
  Administered 2023-12-16: 5 mg via INTRAVENOUS
  Administered 2023-12-16 (×5): 10 mg via INTRAVENOUS

## 2023-12-16 MED ORDER — ONDANSETRON HCL 4 MG PO TABS: 4.0000 mg | ORAL_TABLET | Freq: Four times a day (QID) | ORAL | Status: DC | PRN

## 2023-12-16 MED ORDER — DUTASTERIDE 0.5 MG PO CAPS
0.5000 mg | ORAL_CAPSULE | Freq: Every morning | ORAL | Status: DC
  Administered 2023-12-17: 0.5 mg via ORAL
  Filled 2023-12-16: qty 1

## 2023-12-16 MED ORDER — EPHEDRINE 5 MG/ML INJ
INTRAVENOUS | Status: AC
Start: 2023-12-16 — End: 2023-12-16
  Filled 2023-12-16: qty 5

## 2023-12-16 MED ORDER — SODIUM CHLORIDE 0.9 % IR SOLN
Status: DC | PRN
  Administered 2023-12-16: 250 mL
  Administered 2023-12-16: 3000 mL
  Administered 2023-12-16: 1000 mL

## 2023-12-16 MED ORDER — ALBUMIN HUMAN 5 % IV SOLN
12.5000 g | Freq: Once | INTRAVENOUS | Status: AC
  Administered 2023-12-16: 12.5 g via INTRAVENOUS

## 2023-12-16 MED ORDER — PHENYLEPHRINE HCL-NACL 20-0.9 MG/250ML-% IV SOLN
INTRAVENOUS | Status: DC | PRN
  Administered 2023-12-16: 50 ug/min via INTRAVENOUS

## 2023-12-16 MED ORDER — BUPIVACAINE IN DEXTROSE 0.75-8.25 % IT SOLN
INTRATHECAL | Status: DC | PRN
  Administered 2023-12-16: 2 mL via INTRATHECAL

## 2023-12-16 MED ORDER — PHENOL 1.4 % MT LIQD: 1.0000 | OROMUCOSAL | Status: DC | PRN

## 2023-12-16 MED ORDER — TADALAFIL 5 MG PO TABS
5.0000 mg | ORAL_TABLET | Freq: Every morning | ORAL | Status: DC
  Administered 2023-12-17: 5 mg via ORAL
  Filled 2023-12-16: qty 1

## 2023-12-16 MED ORDER — SODIUM CHLORIDE (PF) 0.9 % IJ SOLN
INTRAMUSCULAR | Status: DC | PRN
  Administered 2023-12-16: 61 mL

## 2023-12-16 MED ORDER — ACETAMINOPHEN 325 MG PO TABS: 325.0000 mg | ORAL_TABLET | Freq: Four times a day (QID) | ORAL | Status: DC | PRN

## 2023-12-16 MED ORDER — KETOROLAC TROMETHAMINE 30 MG/ML IJ SOLN
INTRAMUSCULAR | Status: AC
Start: 2023-12-16 — End: 2023-12-16
  Filled 2023-12-16: qty 1

## 2023-12-16 MED ORDER — METOCLOPRAMIDE HCL 5 MG PO TABS: 5.0000 mg | ORAL_TABLET | Freq: Three times a day (TID) | ORAL | Status: DC | PRN

## 2023-12-16 MED ORDER — CEFAZOLIN SODIUM-DEXTROSE 2-4 GM/100ML-% IV SOLN
2.0000 g | INTRAVENOUS | Status: AC
  Administered 2023-12-16: 2 g via INTRAVENOUS
  Filled 2023-12-16: qty 100

## 2023-12-16 MED ORDER — DOCUSATE SODIUM 100 MG PO CAPS
100.0000 mg | ORAL_CAPSULE | Freq: Two times a day (BID) | ORAL | Status: DC
  Administered 2023-12-16 – 2023-12-17 (×2): 100 mg via ORAL
  Filled 2023-12-16 (×2): qty 1

## 2023-12-16 SURGICAL SUPPLY — 43 items
BAG COUNTER SPONGE SURGICOUNT (BAG) IMPLANT
BLADE SAW RECIPROCATING 77.5 (BLADE) ×1 IMPLANT
CHLORAPREP W/TINT 26 (MISCELLANEOUS) ×1 IMPLANT
COVER PERINEAL POST (MISCELLANEOUS) ×1 IMPLANT
COVER SURGICAL LIGHT HANDLE (MISCELLANEOUS) ×1 IMPLANT
DERMABOND ADVANCED .7 DNX12 (GAUZE/BANDAGES/DRESSINGS) ×2 IMPLANT
DRAPE IMP U-DRAPE 54X76 (DRAPES) ×1 IMPLANT
DRAPE SHEET LG 3/4 BI-LAMINATE (DRAPES) ×3 IMPLANT
DRAPE STERI IOBAN 125X83 (DRAPES) ×1 IMPLANT
DRAPE U-SHAPE 47X51 STRL (DRAPES) ×2 IMPLANT
DRSG AQUACEL AG ADV 3.5X10 (GAUZE/BANDAGES/DRESSINGS) ×1 IMPLANT
ELECT REM PT RETURN 15FT ADLT (MISCELLANEOUS) ×1 IMPLANT
GLOVE BIO SURGEON STRL SZ7 (GLOVE) ×1 IMPLANT
GLOVE BIO SURGEON STRL SZ8.5 (GLOVE) ×2 IMPLANT
GLOVE BIOGEL PI IND STRL 7.5 (GLOVE) ×1 IMPLANT
GLOVE BIOGEL PI IND STRL 8.5 (GLOVE) ×1 IMPLANT
GOWN SPEC L3 XXLG W/TWL (GOWN DISPOSABLE) ×1 IMPLANT
GOWN STRL REUS W/ TWL XL LVL3 (GOWN DISPOSABLE) ×1 IMPLANT
HEAD CERAMIC BIOLOX 36 T1 STD (Head) IMPLANT
HOOD PEEL AWAY T7 (MISCELLANEOUS) ×3 IMPLANT
KIT TURNOVER KIT A (KITS) ×1 IMPLANT
LINER ACETAB NN G7 F 36 (Liner) IMPLANT
MANIFOLD NEPTUNE II (INSTRUMENTS) ×1 IMPLANT
MARKER SKIN DUAL TIP RULER LAB (MISCELLANEOUS) ×1 IMPLANT
NDL SAFETY ECLIPSE 18X1.5 (NEEDLE) ×1 IMPLANT
NDL SPNL 18GX3.5 QUINCKE PK (NEEDLE) ×1 IMPLANT
NEEDLE SPNL 18GX3.5 QUINCKE PK (NEEDLE) ×1 IMPLANT
PACK ANTERIOR HIP CUSTOM (KITS) ×1 IMPLANT
PENCIL SMOKE EVACUATOR (MISCELLANEOUS) ×1 IMPLANT
SEALER BIPOLAR AQUA 6.0 (INSTRUMENTS) ×1 IMPLANT
SET HNDPC FAN SPRY TIP SCT (DISPOSABLE) ×1 IMPLANT
SHELL ACET G7 4H 56 SZF (Shell) IMPLANT
SOLUTION PRONTOSAN WOUND 350ML (IRRIGATION / IRRIGATOR) ×1 IMPLANT
SPIKE FLUID TRANSFER (MISCELLANEOUS) ×1 IMPLANT
STEM FEM REDUCE DISTAL 11X107 (Stem) IMPLANT
SUT MNCRL AB 3-0 PS2 18 (SUTURE) ×1 IMPLANT
SUT MON AB 2-0 CT1 36 (SUTURE) ×1 IMPLANT
SUT STRATAFIX 14 PDO 48 VLT (SUTURE) ×1 IMPLANT
SUT VIC AB 2-0 CT1 TAPERPNT 27 (SUTURE) IMPLANT
TOWEL GREEN STERILE FF (TOWEL DISPOSABLE) ×1 IMPLANT
TRAY FOLEY MTR SLVR 16FR STAT (SET/KITS/TRAYS/PACK) IMPLANT
TUBE SUCTION HIGH CAP CLEAR NV (SUCTIONS) ×1 IMPLANT
WATER STERILE IRR 1000ML POUR (IV SOLUTION) ×1 IMPLANT

## 2023-12-16 NOTE — Op Note (Signed)
 OPERATIVE REPORT  SURGEON: Redell Shoals, MD   ASSISTANT: Valery Potters, PA-C.  PREOPERATIVE DIAGNOSIS: Right hip arthritis.   POSTOPERATIVE DIAGNOSIS: Right hip arthritis.   PROCEDURE: Right total hip arthroplasty, anterior approach.   IMPLANTS: Biomet Taperloc Complete Microplasty stem, size 11 x 107.5 mm, high offset. Biomet G7 OsseoTi Cup, size 56 mm. Biomet Vivacit-E liner, size 36 mm, F, neutral. Biomet Biolox ceramic head ball, size 36 + 0 mm.  ANESTHESIA:  MAC and Spinal  ESTIMATED BLOOD LOSS:-450 mL    ANTIBIOTICS: 2g Ancef .  DRAINS: None.  COMPLICATIONS: None.   CONDITION: PACU - hemodynamically stable.   BRIEF CLINICAL NOTE: Lance King is a 73 y.o. male with a long-standing history of Right hip arthritis. After failing conservative management, the patient was indicated for total hip arthroplasty. The risks, benefits, and alternatives to the procedure were explained, and the patient elected to proceed.  PROCEDURE IN DETAIL: Surgical site was marked by myself in the pre-op holding area. Once inside the operating room, spinal anesthesia was obtained, and a foley catheter was inserted. The patient was then positioned on the Hana table.  All bony prominences were well padded.  The hip was prepped and draped in the normal sterile surgical fashion.  A time-out was called verifying side and site of surgery. The patient received IV antibiotics within 60 minutes of beginning the procedure.   Bikini incision was made, and superficial dissection was performed lateral to the ASIS. The direct anterior approach to the hip was performed through the Hueter interval.  Lateral femoral circumflex vessels were treated with the Auqumantys. The anterior capsule was exposed and an inverted T capsulotomy was made. The femoral neck cut was made to the level of the templated cut.  A corkscrew was placed into the head and the head was removed.  The femoral head was found to have eburnated  bone. The head was passed to the back table and was measured. Pubofemoral ligament was released off of the calcar, taking care to stay on bone. Superior capsule was released from the greater trochanter, taking care to stay lateral to the posterior border of the femoral neck in order to preserve the short external rotators.   Acetabular exposure was achieved, and the pulvinar and labrum were excised. Sequential reaming of the acetabulum was then performed up to a size 55 mm reamer. A 56 mm cup was then opened and impacted into place at approximately 40 degrees of abduction and 20 degrees of anteversion. The final polyethylene liner was impacted into place and acetabular osteophytes were removed.    I then gained femoral exposure taking care to protect the abductors and greater trochanter.  This was performed using standard external rotation, extension, and adduction.  A cookie cutter was used to enter the femoral canal, and then the femoral canal finder was placed.  Sequential broaching was performed up to a size 11.  Calcar planer was used on the femoral neck remnant.  I placed a high offset neck and a trial head ball.  The hip was reduced.  Leg lengths and offset were checked fluoroscopically.  The hip was dislocated and trial components were removed.  The final implants were placed, and the hip was reduced.  Fluoroscopy was used to confirm component position and leg lengths.  At 90 degrees of external rotation and full extension, the hip was stable to an anterior directed force.   The wound was copiously irrigated with Prontosan solution and normal saline using pulse lavage.  Marcaine  solution was injected into the periarticular soft tissue.  The wound was closed in layers using #1 Vicryl and V-Loc for the fascia, 2-0 Vicryl for the subcutaneous fat, 2-0 Monocryl for the deep dermal layer, 3-0 running Monocryl subcuticular stitch, and Dermabond for the skin.  Once the glue was fully dried, an Aquacell Ag  dressing was applied.  The patient was transported to the recovery room in stable condition.  Sponge, needle, and instrument counts were correct at the end of the case x2.  The patient tolerated the procedure well and there were no known complications.  Please note that a surgical assistant was a medical necessity for this procedure to perform it in a safe and expeditious manner. Assistant was necessary to provide appropriate retraction of vital neurovascular structures, to prevent femoral fracture, and to allow for anatomic placement of the prosthesis.

## 2023-12-16 NOTE — Anesthesia Postprocedure Evaluation (Signed)
 Anesthesia Post Note  Patient: Lance King  Procedure(s) Performed: ARTHROPLASTY, HIP, TOTAL, ANTERIOR APPROACH (Right: Hip)     Patient location during evaluation: PACU Anesthesia Type: Spinal Level of consciousness: awake and alert Pain management: pain level controlled Vital Signs Assessment: post-procedure vital signs reviewed and stable Respiratory status: spontaneous breathing Cardiovascular status: stable Anesthetic complications: no   No notable events documented.  Last Vitals:  Vitals:   12/16/23 1300 12/16/23 1314  BP: 111/74 130/72  Pulse: 64 62  Resp: 19 20  Temp:  (!) 36.3 C  SpO2: 100% 100%    Last Pain:  Vitals:   12/16/23 1356  TempSrc:   PainSc: 0-No pain                 Norleen Pope

## 2023-12-16 NOTE — Anesthesia Procedure Notes (Addendum)
 Spinal  Patient location during procedure: OR Start time: 12/16/2023 8:35 AM End time: 12/16/2023 8:40 AM Reason for block: surgical anesthesia Staffing Performed: anesthesiologist  Anesthesiologist: Darlyn Rush, MD Performed by: Darlyn Rush, MD Authorized by: Darlyn Rush, MD   Preanesthetic Checklist Completed: patient identified, IV checked, site marked, risks and benefits discussed, surgical consent, monitors and equipment checked, pre-op evaluation and timeout performed Spinal Block Patient position: sitting Prep: DuraPrep and site prepped and draped Patient monitoring: heart rate, continuous pulse ox and blood pressure Approach: right paramedian Location: L2-3 Injection technique: single-shot Needle Needle type: Spinocan  Needle gauge: 25 G Needle length: 9 cm Additional Notes Expiration date of kit checked and confirmed. Patient tolerated procedure well, without complications.

## 2023-12-16 NOTE — Transfer of Care (Signed)
 Immediate Anesthesia Transfer of Care Note  Patient: Lance King  Procedure(s) Performed: ARTHROPLASTY, HIP, TOTAL, ANTERIOR APPROACH (Right: Hip)  Patient Location: PACU  Anesthesia Type:General  Level of Consciousness: drowsy and patient cooperative  Airway & Oxygen Therapy: Patient Spontanous Breathing and Patient connected to face mask oxygen  Post-op Assessment: Report given to RN and Post -op Vital signs reviewed and stable  Post vital signs: Reviewed and stable  Last Vitals:  Vitals Value Taken Time  BP 75/54 12/16/23 10:57  Temp    Pulse 62 12/16/23 10:59  Resp 31 12/16/23 10:59  SpO2 100 % 12/16/23 10:59  Vitals shown include unfiled device data.  Last Pain:  Vitals:   12/16/23 0651  TempSrc:   PainSc: 0-No pain         Complications: No notable events documented.

## 2023-12-16 NOTE — Evaluation (Signed)
 Physical Therapy Evaluation Patient Details Name: Lance King MRN: [id number] DOB: 19-Jun-1950 Today's Date: 12/16/2023  History of Present Illness  73 yo male S/P RTHA,  direct anterior on 12/16/23. PMH: back surgery, HTN,  Clinical Impression   Patient ambulated x 70' using RW, tolerated well, no pain, just sore right thigh.  Patient should progress to DC soon. Pt admitted with above diagnosis.  Pt currently with functional limitations due to the deficits listed below (see PT Problem List). Pt will benefit from acute skilled PT to increase their independence and safety with mobility to allow discharge.           If plan is discharge home, recommend the following: A little help with bathing/dressing/bathroom;Assist for transportation;Help with stairs or ramp for entrance   Can travel by private vehicle        Equipment Recommendations None recommended by PT  Recommendations for Other Services       Functional Status Assessment Patient has had a recent decline in their functional status and demonstrates the ability to make significant improvements in function in a reasonable and predictable amount of time.     Precautions / Restrictions Precautions Precautions: Fall Restrictions Weight Bearing Restrictions Per Provider Order: No      Mobility  Bed Mobility Overal bed mobility: Needs Assistance Bed Mobility: Supine to Sit     Supine to sit: Supervision, HOB elevated     General bed mobility comments: used belt  to move RLE    Transfers Overall transfer level: Needs assistance Equipment used: Rolling walker (2 wheels) Transfers: Sit to/from Stand Sit to Stand: Supervision           General transfer comment: cues for hand placement    Ambulation/Gait Ambulation/Gait assistance: Contact guard assist Gait Distance (Feet): 70 Feet Assistive device: Rolling walker (2 wheels) Gait Pattern/deviations: Step-to pattern, Step-through pattern Gait velocity: decr      General Gait Details: gait steady with RW  Stairs            Wheelchair Mobility     Tilt Bed    Modified Rankin (Stroke Patients Only)       Balance Overall balance assessment: Mild deficits observed, not formally tested                                           Pertinent Vitals/Pain Pain Assessment Pain Assessment: 0-10 Pain Score: 1  Pain Descriptors / Indicators: Sore Pain Intervention(s): Monitored during session    Home Living Family/patient expects to be discharged to:: Private residence Living Arrangements: Spouse/significant other Available Help at Discharge: Family;Available 24 hours/day Type of Home: House Home Access: Stairs to enter Entrance Stairs-Rails: Right Entrance Stairs-Number of Steps: 3   Home Layout: One level Home Equipment: Agricultural consultant (2 wheels)      Prior Function Prior Level of Function : Independent/Modified Independent                     Extremity/Trunk Assessment   Upper Extremity Assessment Upper Extremity Assessment: Overall WFL for tasks assessed    Lower Extremity Assessment Lower Extremity Assessment: RLE deficits/detail RLE Deficits / Details: able to flex in supine, WBAT    Cervical / Trunk Assessment Cervical / Trunk Assessment: Normal  Communication   Communication Communication: No apparent difficulties    Cognition Arousal: Alert Behavior During Therapy: [facility] for tasks  assessed/performed   PT - Cognitive impairments: No apparent impairments                         Following commands: Intact       Cueing Cueing Techniques: Verbal cues     General Comments      Exercises Total Joint Exercises Heel Slides: AAROM, Right, 10 reps Long Arc Quad: AROM, Right, 5 reps   Assessment/Plan    PT Assessment Patient needs continued PT services  PT Problem List Decreased strength;Decreased range of motion;Decreased activity tolerance;Decreased  mobility;Decreased safety awareness       PT Treatment Interventions DME instruction;Gait training;Stair training;Functional mobility training;Therapeutic activities;Therapeutic exercise;Patient/family education    PT Goals (Current goals can be found in the Care Plan section)  Acute Rehab PT Goals Patient Stated Goal: go home PT Goal Formulation: With patient Time For Goal Achievement: 12/23/23 Potential to Achieve Goals: Good    Frequency 7X/week     Co-evaluation               AM-PAC PT 6 Clicks Mobility  Outcome Measure Help needed turning from your back to your side while in a flat bed without using bedrails?: None Help needed moving from lying on your back to sitting on the side of a flat bed without using bedrails?: None Help needed moving to and from a bed to a chair (including a wheelchair)?: A Little Help needed standing up from a chair using your arms (e.g., wheelchair or bedside chair)?: A Little Help needed to walk in hospital room?: A Little Help needed climbing 3-5 steps with a railing? : A Little 6 Click Score: 20    End of Session Equipment Utilized During Treatment: Gait belt Activity Tolerance: Patient tolerated treatment well Patient left: in chair;with call bell/phone within reach;with family/visitor present Nurse Communication: Mobility status PT Visit Diagnosis: Unsteadiness on feet (R26.81);Difficulty in walking, not elsewhere classified (R26.2)    Time: 8354-8294 PT Time Calculation (min) (ACUTE ONLY): 20 min   Charges:   PT Evaluation $PT Eval Low Complexity: 1 Low   PT General Charges $$ ACUTE PT VISIT: 1 Visit         Darice Potters PT Acute Rehabilitation Services Office [phone]   Potters Darice Norris 12/16/2023, 5:09 PM

## 2023-12-16 NOTE — Care Plan (Signed)
 Ortho Bundle Case Management Note  Patient Details  Name: Lance King MRN: [id number] Date of Birth: 30-Apr-1950  R THA on 12/16/23  DCP: Home with wife  DME: No needs  PT: HEP                   DME Arranged:  N/A DME Agency:  NA  HH Arranged:    HH Agency:     Additional Comments: Please contact me with any questions of if this plan should need to change.  Lyle Pepper, CCM [facility] [phone]   12/16/2023, 8:16 AM

## 2023-12-16 NOTE — Discharge Instructions (Addendum)
 Dr. Samson Frederic Joint Replacement Specialist [facility] [address], [address] [place], [place] [address] [phone]   TOTAL HIP REPLACEMENT POSTOPERATIVE DIRECTIONS    Hip Rehabilitation, Guidelines Following Surgery   WEIGHT BEARING Weight bearing as tolerated with assist device (walker, cane, etc) as directed, use it as long as suggested by your surgeon or therapist, typically at least 4-6 weeks.  The results of a hip operation are greatly improved after range of motion and muscle strengthening exercises. Follow all safety measures which are given to protect your hip. If any of these exercises cause increased pain or swelling in your joint, decrease the amount until you are comfortable again. Then slowly increase the exercises. Call your caregiver if you have problems or questions.   HOME CARE INSTRUCTIONS  Most of the following instructions are designed to prevent the dislocation of your new hip.  Remove items at home which could result in a fall. This includes throw rugs or furniture in walking pathways.  Continue medications as instructed at time of discharge. You may have some home medications which will be placed on hold until you complete the course of blood thinner medication. You may start showering once you are discharged home. Do not remove your dressing. Do not put on socks or shoes without following the instructions of your caregivers.   Sit on chairs with arms. Use the chair arms to help push yourself up when arising.  Arrange for the use of a toilet seat elevator so you are not sitting low.  Walk with walker as instructed.  You may resume a sexual relationship in one month or when given the OK by your caregiver.  Use walker as long as suggested by your caregivers.  You may put full weight on your legs and walk as much as is comfortable. Avoid periods of inactivity such as sitting longer than an hour when not asleep. This helps prevent blood  clots.  You may return to work once you are cleared by Designer, industrial/product.  Do not drive a car for 6 weeks or until released by your surgeon.  Do not drive while taking narcotics.  Wear elastic stockings for two weeks following surgery during the day but you may remove then at night.  Make sure you keep all of your appointments after your operation with all of your doctors and caregivers. You should call the office at the above phone number and make an appointment for approximately two weeks after the date of your surgery. Please pick up a stool softener and laxative for home use as long as you are requiring pain medications. ICE to the affected hip every three hours for 30 minutes at a time and then as needed for pain and swelling. Continue to use ice on the hip for pain and swelling from surgery. You may notice swelling that will progress down to the foot and ankle.  This is normal after surgery.  Elevate the leg when you are not up walking on it.   It is important for you to complete the blood thinner medication as prescribed by your doctor. Continue to use the breathing machine which will help keep your temperature down.  It is common for your temperature to cycle up and down following surgery, especially at night when you are not up moving around and exerting yourself.  The breathing machine keeps your lungs expanded and your temperature down.  RANGE OF MOTION AND STRENGTHENING EXERCISES  These exercises are designed to help you  keep full movement of your hip joint. Follow your caregiver's or physical therapist's instructions. Perform all exercises about fifteen times, three times per day or as directed. Exercise both hips, even if you have had only one joint replacement. These exercises can be done on a training (exercise) mat, on the floor, on a table or on a bed. Use whatever works the best and is most comfortable for you. Use music or television while you are exercising so that the exercises are a  pleasant break in your day. This will make your life better with the exercises acting as a break in routine you can look forward to.  Lying on your back, slowly slide your foot toward your buttocks, raising your knee up off the floor. Then slowly slide your foot back down until your leg is straight again.  Lying on your back spread your legs as far apart as you can without causing discomfort.  Lying on your side, raise your upper leg and foot straight up from the floor as far as is comfortable. Slowly lower the leg and repeat.  Lying on your back, tighten up the muscle in the front of your thigh (quadriceps muscles). You can do this by keeping your leg straight and trying to raise your heel off the floor. This helps strengthen the largest muscle supporting your knee.  Lying on your back, tighten up the muscles of your buttocks both with the legs straight and with the knee bent at a comfortable angle while keeping your heel on the floor.   SKILLED REHAB INSTRUCTIONS: If the patient is transferred to a skilled rehab facility following release from the hospital, a list of the current medications will be sent to the facility for the patient to continue.  When discharged from the skilled rehab facility, please have the facility set up the patient's Home Health Physical Therapy prior to being released. Also, the skilled facility will be responsible for providing the patient with their medications at time of release from the facility to include their pain medication and their blood thinner medication. If the patient is still at the rehab facility at time of the two week follow up appointment, the skilled rehab facility will also need to assist the patient in arranging follow up appointment in our office and any transportation needs.  POST-OPERATIVE OPIOID TAPER INSTRUCTIONS: It is important to wean off of your opioid medication as soon as possible. If you do not need pain medication after your surgery it is ok  to stop day one. Opioids include: Codeine, Hydrocodone(Norco, Vicodin), Oxycodone(Percocet, oxycontin) and hydromorphone amongst others.  Long term and even short term use of opiods can cause: Increased pain response Dependence Constipation Depression Respiratory depression And more.  Withdrawal symptoms can include Flu like symptoms Nausea, vomiting And more Techniques to manage these symptoms Hydrate well Eat regular healthy meals Stay active Use relaxation techniques(deep breathing, meditating, yoga) Do Not substitute Alcohol to help with tapering If you have been on opioids for less than two weeks and do not have pain than it is ok to stop all together.  Plan to wean off of opioids This plan should start within one week post op of your joint replacement. Maintain the same interval or time between taking each dose and first decrease the dose.  Cut the total daily intake of opioids by one tablet each day Next start to increase the time between doses. The last dose that should be eliminated is the evening dose.    MAKE  SURE YOU:  Understand these instructions.  Will watch your condition.  Will get help right away if you are not doing well or get worse.  Pick up stool softner and laxative for home use following surgery while on pain medications. Do not remove your dressing. The dressing is waterproof--it is OK to take showers. Continue to use ice for pain and swelling after surgery. Do not use any lotions or creams on the incision until instructed by your surgeon. Total Hip Protocol.   Information on my medicine - ELIQUIS (apixaban)   Why was Eliquis prescribed for you? Eliquis was prescribed for you to reduce the risk of blood clots forming after orthopedic surgery.    What do You need to know about Eliquis? Take your Eliquis TWICE DAILY - one tablet in the morning and one tablet in the evening with or without food.  It would be best to take the dose about the  same time each day.  If you have difficulty swallowing the tablet whole please discuss with your pharmacist how to take the medication safely.  Take Eliquis exactly as prescribed by your doctor and DO NOT stop taking Eliquis without talking to the doctor who prescribed the medication.  Stopping without other medication to take the place of Eliquis may increase your risk of developing a clot.  After discharge, you should have regular check-up appointments with your healthcare provider that is prescribing your Eliquis.  What do you do if you miss a dose? If a dose of ELIQUIS is not taken at the scheduled time, take it as soon as possible on the same day and twice-daily administration should be resumed.  The dose should not be doubled to make up for a missed dose.  Do not take more than one tablet of ELIQUIS at the same time.  Important Safety Information A possible side effect of Eliquis is bleeding. You should call your healthcare provider right away if you experience any of the following: Bleeding from an injury or your nose that does not stop. Unusual colored urine (red or dark brown) or unusual colored stools (red or black). Unusual bruising for unknown reasons. A serious fall or if you hit your head (even if there is no bleeding).  Some medicines may interact with Eliquis and might increase your risk of bleeding or clotting while on Eliquis. To help avoid this, consult your healthcare provider or pharmacist prior to using any new prescription or non-prescription medications, including herbals, vitamins, non-steroidal anti-inflammatory drugs (NSAIDs) and supplements.  This website has more information on Eliquis (apixaban): http://www.eliquis.com/eliquis/home

## 2023-12-16 NOTE — Interval H&P Note (Signed)
 History and Physical Interval Note:  12/16/2023 8:20 AM  Lance King  has presented today for surgery, with the diagnosis of Right hip osteoarthritis.  The various methods of treatment have been discussed with the patient and family. After consideration of risks, benefits and other options for treatment, the patient has consented to  Procedure(s): ARTHROPLASTY, HIP, TOTAL, ANTERIOR APPROACH (Right) as a surgical intervention.  The patient's history has been reviewed, patient examined, no change in status, stable for surgery.  I have reviewed the patient's chart and labs.  Questions were answered to the patient's satisfaction.     Redell PARAS Nesha Counihan

## 2023-12-17 ENCOUNTER — Encounter (HOSPITAL_COMMUNITY): Payer: Self-pay | Admitting: Orthopedic Surgery

## 2023-12-17 DIAGNOSIS — Z96641 Presence of right artificial hip joint: Secondary | ICD-10-CM | POA: Insufficient documentation

## 2023-12-17 DIAGNOSIS — M1611 Unilateral primary osteoarthritis, right hip: Secondary | ICD-10-CM | POA: Diagnosis not present

## 2023-12-17 LAB — BASIC METABOLIC PANEL WITH GFR
Anion gap: 10 (ref 5–15)
BUN: 21 mg/dL (ref 8–23)
CO2: 23 mmol/L (ref 22–32)
Calcium: 8 mg/dL — ABNORMAL LOW (ref 8.9–10.3)
Chloride: 103 mmol/L (ref 98–111)
Creatinine, Ser: 1.21 mg/dL (ref 0.61–1.24)
GFR, Estimated: >60 mL/min (ref >60)
Glucose, Bld: 142 mg/dL — ABNORMAL HIGH (ref 70–99)
Potassium: 4.1 mmol/L (ref 3.5–5.1)
Sodium: 135 mmol/L (ref 135–145)

## 2023-12-17 LAB — CBC
HCT: 30.3 % — ABNORMAL LOW (ref 39.0–52.0)
Hemoglobin: 10 g/dL — ABNORMAL LOW (ref 13.0–17.0)
MCH: 27.7 pg (ref 26.0–34.0)
MCHC: 33 g/dL (ref 30.0–36.0)
MCV: 83.9 fL (ref 80.0–100.0)
Platelets: 187 K/uL (ref 150–400)
RBC: 3.61 MIL/uL — ABNORMAL LOW (ref 4.22–5.81)
RDW: 13.9 % (ref 11.5–15.5)
WBC: 7.2 K/uL (ref 4.0–10.5)
nRBC: 0 % (ref 0.0–0.2)

## 2023-12-17 MED ORDER — SENNA 8.6 MG PO TABS: 2.0000 | ORAL_TABLET | Freq: Every day | ORAL | 0 refills | Status: AC

## 2023-12-17 MED ORDER — POLYETHYLENE GLYCOL 3350 17 G PO PACK
17.0000 g | PACK | Freq: Every day | ORAL | 0 refills | Status: AC | PRN
Start: 2023-12-17 — End: 2024-01-16

## 2023-12-17 MED ORDER — HYDROCODONE-ACETAMINOPHEN 5-325 MG PO TABS: 1.0000 | ORAL_TABLET | ORAL | 0 refills | Status: AC | PRN

## 2023-12-17 MED ORDER — DOCUSATE SODIUM 100 MG PO CAPS
100.0000 mg | ORAL_CAPSULE | Freq: Two times a day (BID) | ORAL | 0 refills | Status: AC
Start: 2023-12-17 — End: 2024-01-16

## 2023-12-17 MED ORDER — ONDANSETRON HCL 4 MG PO TABS: 4.0000 mg | ORAL_TABLET | Freq: Three times a day (TID) | ORAL | 0 refills | Status: DC | PRN

## 2023-12-17 MED ORDER — APIXABAN 2.5 MG PO TABS: 2.5000 mg | ORAL_TABLET | Freq: Two times a day (BID) | ORAL | 0 refills | Status: DC

## 2023-12-17 NOTE — Progress Notes (Signed)
 Physical Therapy Treatment Patient Details Name: Lance King MRN: [id number] DOB: 11-25-1950 Today's Date: 12/17/2023   History of Present Illness 73 yo male S/P RTHA,  direct anterior on 12/16/23. PMH: back surgery, HTN,    PT Comments  POD # 1 am session Pt progressing well.  Able to self OOB and amb a functional distance as well as safely navigate stairs.  Then returned to room to perform some TE's following HEP handout.  Instructed on proper tech, freq as well as use of ICE.   Addressed all mobility questions, discussed appropriate activity, educated on use of ICE.  Pt ready for D/C to home.    If plan is discharge home, recommend the following: A little help with bathing/dressing/bathroom;Assist for transportation;Help with stairs or ramp for entrance   Can travel by private vehicle        Equipment Recommendations  None recommended by PT    Recommendations for Other Services       Precautions / Restrictions Precautions Precautions: Fall Restrictions Weight Bearing Restrictions Per Provider Order: No RLE Weight Bearing Per Provider Order: Weight bearing as tolerated     Mobility  Bed Mobility Overal bed mobility: Modified Independent             General bed mobility comments: self able using strap    Transfers Overall transfer level: Modified independent                 General transfer comment: self able with good use of hands to steady self.    Ambulation/Gait Ambulation/Gait assistance: Supervision Gait Distance (Feet): 55 Feet Assistive device: Rolling walker (2 wheels) Gait Pattern/deviations: Step-to pattern, Step-through pattern Gait velocity: decr     General Gait Details: tolerated a functional distance with good safety awareness   Stairs Stairs: Yes Stairs assistance: Supervision Stair Management: One rail Left, Step to pattern, Forwards Number of Stairs: 2 General stair comments: practiced 2 steps using ONE rail with cane the  first attempt and 2 hands on one rail the second attempt.  Pt able to recall proper sequencing.   Wheelchair Mobility     Tilt Bed    Modified Rankin (Stroke Patients Only)       Balance                                            Communication Communication Communication: No apparent difficulties  Cognition Arousal: Alert Behavior During Therapy: WFL for tasks assessed/performed   PT - Cognitive impairments: No apparent impairments                       PT - Cognition Comments: AxO x 3 pleasant and motivated Following commands: Intact      Cueing Cueing Techniques: Verbal cues  Exercises   Total Hip Replacement TE's following HEP Handout 10 reps ankle pumps 05 reps knee presses 05 reps heel slides 05 reps SAQ's 05 reps ABD Instructed how to use a belt loop to assist  Followed by ICE    General Comments        Pertinent Vitals/Pain Pain Assessment Pain Assessment: Faces Faces Pain Scale: Hurts a little bit Pain Location: R hip Pain Descriptors / Indicators: Sore, Tender, Operative site guarding Pain Intervention(s): Monitored during session, Premedicated before session, Repositioned, Ice applied    Home Living  Prior Function            PT Goals (current goals can now be found in the care plan section) Progress towards PT goals: Progressing toward goals    Frequency    7X/week      PT Plan      Co-evaluation              AM-PAC PT 6 Clicks Mobility   Outcome Measure  Help needed turning from your back to your side while in a flat bed without using bedrails?: None Help needed moving from lying on your back to sitting on the side of a flat bed without using bedrails?: None Help needed moving to and from a bed to a chair (including a wheelchair)?: None Help needed standing up from a chair using your arms (e.g., wheelchair or bedside chair)?: None Help needed to walk in  hospital room?: None Help needed climbing 3-5 steps with a railing? : A Little 6 Click Score: 23    End of Session Equipment Utilized During Treatment: Gait belt Activity Tolerance: Patient tolerated treatment well Patient left: in chair;with call bell/phone within reach;with family/visitor present Nurse Communication: Mobility status PT Visit Diagnosis: Unsteadiness on feet (R26.81);Difficulty in walking, not elsewhere classified (R26.2)     Time: 8870-8842 PT Time Calculation (min) (ACUTE ONLY): 28 min  Charges:    $Gait Training: 8-22 mins $Therapeutic Exercise: 8-22 mins PT General Charges $$ ACUTE PT VISIT: 1 Visit                     Katheryn Leap  PTA Acute  Rehabilitation Services Office M-F          [phone]

## 2023-12-17 NOTE — Progress Notes (Signed)
    Subjective:  Patient reports pain as none.  Denies N/V/CP/SOB/ABD pain. He has not needed an PO or IV analgesics. Reports no pain this am. Denies tingling or numbness in LE bilaterally.  Catheter removed this morning. Void pending.  He is eager for d/c home this date.   Objective:   VITALS:   Vitals:   12/16/23 1826 12/16/23 2044 12/17/23 0240 12/17/23 0637  BP: 118/64 113/65 117/64 115/67  Pulse: 73 78 84 85  Resp: 17 20 20 18   Temp:  98.7 F (37.1 C) 99 F (37.2 C) 99.2 F (37.3 C)  TempSrc:  Oral Oral Oral  SpO2: 100% 100% 99% 99%  Weight:      Height:        NAD Neurologically intact ABD soft Neurovascular intact Sensation intact distally Intact pulses distally Dorsiflexion/Plantar flexion intact Incision: dressing C/D/I No cellulitis present Compartment soft   Lab Results  Component Value Date   WBC 7.2 12/17/2023   HGB 10.0 (L) 12/17/2023   HCT 30.3 (L) 12/17/2023   MCV 83.9 12/17/2023   PLT 187 12/17/2023   BMET    Component Value Date/Time   NA 135 12/17/2023 0405   K 4.1 12/17/2023 0405   CL 103 12/17/2023 0405   CO2 23 12/17/2023 0405   GLUCOSE 142 (H) 12/17/2023 0405   BUN 21 12/17/2023 0405   CREATININE 1.21 12/17/2023 0405   CREATININE 1.08 11/19/2022 1315   CALCIUM 8.0 (L) 12/17/2023 0405   EGFR 72 11/19/2022 1315   GFRNONAA >60 12/17/2023 0405   GFRNONAA >60 12/06/2019 0910   GFRNONAA 76 11/17/2019 1333     Assessment/Plan: 1 Day Post-Op   Principal Problem:   Primary osteoarthritis of right hip Active Problems:   S/p revision of right total hip   WBAT with walker DVT ppx: Eliquis , SCDs, TEDS PO pain control PT/OT: He ambulated 70 feet with PT yesterday. Continue today.  Dispo: - D/c home with HEP once cleared with PT and voided   Lance King 12/17/2023, 7:29 AM   [facility]  [place] [address], [place], [place] [address] Phone: [phone] [email or web address] Facebook   [employer]

## 2023-12-17 NOTE — Discharge Summary (Signed)
 Physician Discharge Summary  Patient ID: Lance King MRN: [id number] DOB/AGE: 74/11/1950 74 y.o.  Admit date: 12/16/2023 Discharge date: 12/17/2023  Admission Diagnoses:  Primary osteoarthritis of right hip  Discharge Diagnoses:  Principal Problem:   Primary osteoarthritis of right hip Active Problems:   S/P total right hip arthroplasty   Past Medical History:  Diagnosis Date   Complication of anesthesia    states a smaller tube has been used in the past due to sore throat   Elevated PSA    Hypertension    EKG PCP 02/05/11 on chart   Internal hemorrhoids    MGUS (monoclonal gammopathy of unknown significance)    Paget disease of bone    Pneumonia    Tubular adenoma of colon    Vitamin D  deficiency     Surgeries: Procedure(s): ARTHROPLASTY, HIP, TOTAL, ANTERIOR APPROACH on 12/16/2023   Consultants (if any):   Discharged Condition: Improved  Hospital Course: Lance King is an 73 y.o. male who was admitted 12/16/2023 with a diagnosis of Primary osteoarthritis of right hip and went to the operating room on 12/16/2023 and underwent the above named procedures.    He was given perioperative antibiotics:  Anti-infectives (From admission, onward)    Start     Dose/Rate Route Frequency Ordered Stop   12/16/23 1430  ceFAZolin  (ANCEF ) IVPB 2g/100 mL premix        2 g 200 mL/hr over 30 Minutes Intravenous Every 6 hours 12/16/23 1330 12/16/23 2128   12/16/23 0645  ceFAZolin  (ANCEF ) IVPB 2g/100 mL premix        2 g 200 mL/hr over 30 Minutes Intravenous On call to O.R. 12/16/23 [id number] 12/16/23 9157       He was given sequential compression devices, early ambulation, and Eliquis  for DVT prophylaxis.  POD he ambulated 70 feet with PT.  POD#1 ABLA hemoglobin 10.0 asymptomatic. He ambulated well with PT. D/c home with HEP. F/u in 2 weeks for repeat evaluation.   He benefited maximally from the hospital stay and there were no complications.    Recent vital signs:  Vitals:    12/17/23 0240 12/17/23 0637  BP: 117/64 115/67  Pulse: 84 85  Resp: 20 18  Temp: 99 F (37.2 C) 99.2 F (37.3 C)  SpO2: 99% 99%    Recent laboratory studies:  Lab Results  Component Value Date   HGB 10.0 (L) 12/17/2023   HGB 15.2 12/09/2023   HGB 16.3 12/06/2019   Lab Results  Component Value Date   WBC 7.2 12/17/2023   PLT 187 12/17/2023   Lab Results  Component Value Date   INR 0.91 03/03/2011   Lab Results  Component Value Date   NA 135 12/17/2023   K 4.1 12/17/2023   CL 103 12/17/2023   CO2 23 12/17/2023   BUN 21 12/17/2023   CREATININE 1.21 12/17/2023   GLUCOSE 142 (H) 12/17/2023     Allergies as of 12/17/2023   No Known Allergies      Medication List     TAKE these medications    apixaban  2.5 MG Tabs tablet Commonly known as: Eliquis  Take 1 tablet (2.5 mg total) by mouth 2 (two) times daily.   docusate sodium  100 MG capsule Commonly known as: Colace Take 1 capsule (100 mg total) by mouth 2 (two) times daily.   dutasteride  0.5 MG capsule Commonly known as: AVODART  Take 0.5 mg by mouth in the morning.   HYDROcodone -acetaminophen  5-325 MG tablet Commonly known as: NORCO/VICODIN  Take 1 tablet by mouth every 4 (four) hours as needed for up to 7 days for moderate pain (pain score 4-6) or severe pain (pain score 7-10).   lisinopril-hydrochlorothiazide 20-25 MG tablet Commonly known as: ZESTORETIC Take 1 tablet by mouth in the morning.   ondansetron  4 MG tablet Commonly known as: Zofran  Take 1 tablet (4 mg total) by mouth every 8 (eight) hours as needed for nausea or vomiting.   polyethylene glycol 17 g packet Commonly known as: MiraLax  Take 17 g by mouth daily as needed for mild constipation or moderate constipation.   PROSTATE HEALTH PO Take 1 capsule by mouth in the morning. Urinozinc Prostate Plus   senna 8.6 MG Tabs tablet Commonly known as: SENOKOT Take 2 tablets (17.2 mg total) by mouth at bedtime for 15 days.   tadalafil  5 MG  tablet Commonly known as: CIALIS  Take 5 mg by mouth in the morning.   Vitamin D  50 MCG (2000 UT) Caps Take 2,000 Units by mouth in the morning.               Discharge Care Instructions  (From admission, onward)           Start     Ordered   12/17/23 0000  Weight bearing as tolerated        12/17/23 0734   12/17/23 0000  Change dressing       Comments: Do not remove your dressing.   12/17/23 0734              WEIGHT BEARING   Weight bearing as tolerated with assist device (walker, cane, etc) as directed, use it as long as suggested by your surgeon or therapist, typically at least 4-6 weeks.   EXERCISES  Results after joint replacement surgery are often greatly improved when you follow the exercise, range of motion and muscle strengthening exercises prescribed by your doctor. Safety measures are also important to protect the joint from further injury. Any time any of these exercises cause you to have increased pain or swelling, decrease what you are doing until you are comfortable again and then slowly increase them. If you have problems or questions, call your caregiver or physical therapist for advice.   Rehabilitation is important following a joint replacement. After just a few days of immobilization, the muscles of the leg can become weakened and shrink (atrophy).  These exercises are designed to build up the tone and strength of the thigh and leg muscles and to improve motion. Often times heat used for twenty to thirty minutes before working out will loosen up your tissues and help with improving the range of motion but do not use heat for the first two weeks following surgery (sometimes heat can increase post-operative swelling).   These exercises can be done on a training (exercise) mat, on the floor, on a table or on a bed. Use whatever works the best and is most comfortable for you.    Use music or television while you are exercising so that the exercises are a  pleasant break in your day. This will make your life better with the exercises acting as a break in your routine that you can look forward to.   Perform all exercises about fifteen times, three times per day or as directed.  You should exercise both the operative leg and the other leg as well.  Exercises include:   Quad Sets - Tighten up the muscle on the front of the thigh [employer])  and hold for 5-10 seconds.   Straight Leg Raises - With your knee straight (if you were given a brace, keep it on), lift the leg to 60 degrees, hold for 3 seconds, and slowly lower the leg.  Perform this exercise against resistance later as your leg gets stronger.  Leg Slides: Lying on your back, slowly slide your foot toward your buttocks, bending your knee up off the floor (only go as far as is comfortable). Then slowly slide your foot back down until your leg is flat on the floor again.  Angel Wings: Lying on your back spread your legs to the side as far apart as you can without causing discomfort.  Hamstring Strength:  Lying on your back, push your heel against the floor with your leg straight by tightening up the muscles of your buttocks.  Repeat, but this time bend your knee to a comfortable angle, and push your heel against the floor.  You may put a pillow under the heel to make it more comfortable if necessary.   A rehabilitation program following joint replacement surgery can speed recovery and prevent re-injury in the future due to weakened muscles. Contact your doctor or a physical therapist for more information on knee rehabilitation.    CONSTIPATION  Constipation is defined medically as fewer than three stools per week and severe constipation as less than one stool per week.  Even if you have a regular bowel pattern at home, your normal regimen is likely to be disrupted due to multiple reasons following surgery.  Combination of anesthesia, postoperative narcotics, change in appetite and fluid intake all can  affect your bowels.   YOU MUST use at least one of the following options; they are listed in order of increasing strength to get the job done.  They are all available over the counter, and you may need to use some, POSSIBLY even all of these options:    Drink plenty of fluids (prune juice may be helpful) and high fiber foods Colace 100 mg by mouth twice a day  Senokot for constipation as directed and as needed Dulcolax (bisacodyl ), take with full glass of water  Miralax  (polyethylene glycol) once or twice a day as needed.  If you have tried all these things and are unable to have a bowel movement in the first 3-4 days after surgery call either your surgeon or your primary doctor.    If you experience loose stools or diarrhea, hold the medications until you stool forms back up.  If your symptoms do not get better within 1 week or if they get worse, check with your doctor.  If you experience the worst abdominal pain ever or develop nausea or vomiting, please contact the office immediately for further recommendations for treatment.   ITCHING:  If you experience itching with your medications, try taking only a single pain pill, or even half a pain pill at a time.  You can also use Benadryl  over the counter for itching or also to help with sleep.   TED HOSE STOCKINGS:  Use stockings on both legs until for at least 2 weeks or as directed by physician office. They may be removed at night for sleeping.  MEDICATIONS:  See your medication summary on the "After Visit Summary" that nursing will review with you.  You may have some home medications which will be placed on hold until you complete the course of blood thinner medication.  It is important for you to complete the blood thinner  medication as prescribed.  PRECAUTIONS:  If you experience chest pain or shortness of breath - call 911 immediately for transfer to the hospital emergency department.   If you develop a fever greater that 101 F, purulent  drainage from wound, increased redness or drainage from wound, foul odor from the wound/dressing, or calf pain - CONTACT YOUR SURGEON.                                                   FOLLOW-UP APPOINTMENTS:  If you do not already have a post-op appointment, please call the office for an appointment to be seen by your surgeon.  Guidelines for how soon to be seen are listed in your "After Visit Summary", but are typically between 1-4 weeks after surgery.  OTHER INSTRUCTIONS:   Knee Replacement:  Do not place pillow under knee, focus on keeping the knee straight while resting. CPM instructions: 0-90 degrees, 2 hours in the morning, 2 hours in the afternoon, and 2 hours in the evening. Place foam block, curve side up under heel at all times except when in CPM or when walking.  DO NOT modify, tear, cut, or change the foam block in any way.   MAKE SURE YOU:  Understand these instructions.  Get help right away if you are not doing well or get worse.    Thank you for letting us  be a part of your medical care team.  It is a privilege we respect greatly.  We hope these instructions will help you stay on track for a fast and full recovery!   Diagnostic Studies: DG Pelvis Portable Result Date: 12/16/2023 EXAM: 1 or 2 VIEW(S) XRAY OF THE PELVIS 12/16/2023 11:42:45 AM COMPARISON: None available. CLINICAL HISTORY: S/P total right hip arthroplasty [id number]. Post op FINDINGS: BONES AND JOINTS: Right hip arthroplasty noted. Mild degenerative changes of the left hip. No acute fracture. No focal osseous lesion. No joint dislocation. SOFT TISSUES: Expected soft tissue edema and emphysema about the right hip. IMPRESSION: 1. Right hip arthroplasty with expected soft tissue edema and emphysema. 2. Mild degenerative changes of the left hip. Electronically signed by: Donnice Mania MD 12/16/2023 12:58 PM EDT RP Workstation: [id number]   DG HIP UNILAT WITH PELVIS 1V RIGHT Result Date: 12/16/2023 CLINICAL DATA:  Elective  surgery. EXAM: DG HIP (WITH OR WITHOUT PELVIS) 1V RIGHT COMPARISON:  None Available. FINDINGS: Two fluoroscopic spot views of the pelvis and right hip obtained in the operating room. Images during hip arthroplasty. Fluoroscopy time 8 seconds. Dose 1.5431 mGy. IMPRESSION: Intraoperative fluoroscopy during right hip arthroplasty. Electronically Signed   By: Andrea Gasman M.D.   On: 12/16/2023 11:06   DG C-Arm 1-60 Min-No Report Result Date: 12/16/2023 Fluoroscopy was utilized by the requesting physician.  No radiographic interpretation.    Disposition: Discharge disposition: 01-Home or Self Care       Discharge Instructions     Call MD / Call 911   Complete by: As directed    If you experience chest pain or shortness of breath, CALL 911 and be transported to the hospital emergency room.  If you develope a fever above 101 F, pus (white drainage) or increased drainage or redness at the wound, or calf pain, call your surgeon's office.   Change dressing   Complete by: As directed    Do not remove your  dressing.   Constipation Prevention   Complete by: As directed    Drink plenty of fluids.  Prune juice may be helpful.  You may use a stool softener, such as Colace (over the counter) 100 mg twice a day.  Use MiraLax  (over the counter) for constipation as needed.   Diet - low sodium heart healthy   Complete by: As directed    Discharge instructions   Complete by: As directed    Elevate toes above nose. Use cryotherapy as needed for pain and swelling.   Driving restrictions   Complete by: As directed    No driving for 6 weeks   Increase activity slowly as tolerated   Complete by: As directed    Lifting restrictions   Complete by: As directed    No lifting for 6 weeks   Post-operative opioid taper instructions:   Complete by: As directed    POST-OPERATIVE OPIOID TAPER INSTRUCTIONS: It is important to wean off of your opioid medication as soon as possible. If you do not need pain  medication after your surgery it is ok to stop day one. Opioids include: Codeine, Hydrocodone (Norco, Vicodin), Oxycodone(Percocet, oxycontin) and hydromorphone  amongst others.  Long term and even short term use of opiods can cause: Increased pain response Dependence Constipation Depression Respiratory depression And more.  Withdrawal symptoms can include Flu like symptoms Nausea, vomiting And more Techniques to manage these symptoms Hydrate well Eat regular healthy meals Stay active Use relaxation techniques(deep breathing, meditating, yoga) Do Not substitute Alcohol  to help with tapering If you have been on opioids for less than two weeks and do not have pain than it is ok to stop all together.  Plan to wean off of opioids This plan should start within one week post op of your joint replacement. Maintain the same interval or time between taking each dose and first decrease the dose.  Cut the total daily intake of opioids by one tablet each day Next start to increase the time between doses. The last dose that should be eliminated is the evening dose.      TED hose   Complete by: As directed    Use stockings (TED hose) for 2 weeks on both leg(s).  You may remove them at night for sleeping.   Weight bearing as tolerated   Complete by: As directed         Follow-up Information     Leigh Valery RAMAN, PA-C. Go on 01/04/2024.   Specialty: Orthopedic Surgery Why: You are scheduled for a post op appointment on Monday 01/04/24 at 11:00am Contact information: [address] [place] [address] [phone]                  Signed: Valery RAMAN Leigh 12/17/2023, 7:35 AM

## 2023-12-17 NOTE — TOC Transition Note (Signed)
 Transition of Care [facility]) - Discharge Note   Patient Details  Name: Lance King MRN: [id number] Date of Birth: 03-03-1951  Transition of Care [facility] CM/SW Contact:  Alfonse JONELLE Rex, RN Phone Number: 12/17/2023, 11:00 AM   Clinical Narrative:  Met with patient to review dc therapy and home equipment needs, pt confirmed HEP, has RW, no home DME needs. No TOC needs.       Final next level of care: Home/Self Care Barriers to Discharge: No Barriers Identified   Patient Goals and CMS Choice Patient states their goals for this hospitalization and ongoing recovery are:: return home          Discharge Placement                       Discharge Plan and Services Additional resources added to the After Visit Summary for                  DME Arranged: N/A DME Agency: NA                  Social Drivers of Health (SDOH) Interventions SDOH Screenings   Food Insecurity: No Food Insecurity (12/16/2023)  Housing: Low Risk  (12/16/2023)  Transportation Needs: No Transportation Needs (12/16/2023)  Utilities: Not At Risk (12/16/2023)  Social Connections: Socially Integrated (12/16/2023)  Tobacco Use: Medium Risk (12/16/2023)     Readmission Risk Interventions     No data to display

## 2023-12-17 NOTE — Plan of Care (Signed)
   Problem: Coping: Goal: Level of anxiety will decrease Outcome: Progressing   Problem: Pain Managment: Goal: General experience of comfort will improve and/or be controlled Outcome: Progressing   Problem: Safety: Goal: Ability to remain free from injury will improve Outcome: Progressing

## 2024-01-04 DIAGNOSIS — Z471 Aftercare following joint replacement surgery: Secondary | ICD-10-CM | POA: Diagnosis not present

## 2024-01-04 DIAGNOSIS — Z96641 Presence of right artificial hip joint: Secondary | ICD-10-CM | POA: Diagnosis not present

## 2024-01-25 DIAGNOSIS — Z23 Encounter for immunization: Secondary | ICD-10-CM | POA: Diagnosis not present

## 2024-02-01 DIAGNOSIS — Z471 Aftercare following joint replacement surgery: Secondary | ICD-10-CM | POA: Diagnosis not present

## 2024-02-01 DIAGNOSIS — Z96641 Presence of right artificial hip joint: Secondary | ICD-10-CM | POA: Diagnosis not present

## 2024-02-15 DIAGNOSIS — Z8042 Family history of malignant neoplasm of prostate: Secondary | ICD-10-CM | POA: Diagnosis not present

## 2024-02-15 DIAGNOSIS — R972 Elevated prostate specific antigen [PSA]: Secondary | ICD-10-CM | POA: Diagnosis not present

## 2024-02-15 DIAGNOSIS — N401 Enlarged prostate with lower urinary tract symptoms: Secondary | ICD-10-CM | POA: Diagnosis not present

## 2024-02-24 DIAGNOSIS — E785 Hyperlipidemia, unspecified: Secondary | ICD-10-CM | POA: Diagnosis not present

## 2024-02-24 DIAGNOSIS — I10 Essential (primary) hypertension: Secondary | ICD-10-CM | POA: Diagnosis not present

## 2024-02-24 DIAGNOSIS — Z6825 Body mass index (BMI) 25.0-25.9, adult: Secondary | ICD-10-CM | POA: Diagnosis not present

## 2024-02-24 DIAGNOSIS — E1159 Type 2 diabetes mellitus with other circulatory complications: Secondary | ICD-10-CM | POA: Diagnosis not present

## 2024-02-24 DIAGNOSIS — I152 Hypertension secondary to endocrine disorders: Secondary | ICD-10-CM | POA: Diagnosis not present

## 2024-04-07 NOTE — Progress Notes (Addendum)
 "  Office Visit Note  Patient: Lance King             Date of Birth: 1951-01-24           MRN: [id number]             PCP: Trinidad Glisson, MD Referring: Trinidad Glisson, MD Visit Date: 04/21/2024 Occupation: Data Unavailable  Subjective:  Right knee discomfort  History of Present Illness: Lance King is a 74 y.o. male with Paget's disease and osteoarthritis.  He returns today after his last visit on November 19, 2022.  He denies any increased pain or discomfort.  He states he had right total hip replacement in April 2025 by Dr. Fidel and had good recovery from it.  He notices some throbbing in his right knee joint after walking for prolonged time.  He has not noticed joint swelling.    Activities of Daily Living:  Patient reports morning stiffness for a few steps.  Patient Denies nocturnal pain.  Difficulty dressing/grooming: Denies Difficulty climbing stairs: Denies Difficulty getting out of chair: Denies Difficulty using hands for taps, buttons, cutlery, and/or writing: Denies  Review of Systems  Constitutional:  Negative for fatigue.  HENT:  Negative for mouth sores and mouth dryness.   Eyes:  Negative for dryness.  Respiratory:  Negative for shortness of breath.   Cardiovascular:  Negative for chest pain and palpitations.  Gastrointestinal:  Negative for blood in stool, constipation and diarrhea.  Endocrine: Negative for increased urination.  Genitourinary:  Negative for involuntary urination.  Musculoskeletal:  Positive for morning stiffness. Negative for joint pain, gait problem, joint pain, joint swelling, myalgias, muscle weakness, muscle tenderness and myalgias.  Skin:  Negative for color change, rash, hair loss and sensitivity to sunlight.  Allergic/Immunologic: Negative for susceptible to infections.  Neurological:  Negative for dizziness and headaches.  Hematological:  Negative for swollen glands.  Psychiatric/Behavioral:  Negative for depressed mood  and sleep disturbance. The patient is not nervous/anxious.     PMFS History:  Patient Active Problem List   Diagnosis Date Noted   S/P total right hip arthroplasty 12/17/2023   Primary osteoarthritis of right hip 12/16/2023   Paget's disease of bone 04/20/2018   MGUS (monoclonal gammopathy of unknown significance) 04/20/2018   Vitamin D  deficiency 04/20/2018   DDD (degenerative disc disease), lumbar 04/06/2018   Primary osteoarthritis of right knee 04/06/2018   Adhesive capsulitis of right shoulder 04/06/2018   Essential hypertension 04/06/2018    Past Medical History:  Diagnosis Date   Complication of anesthesia    states a smaller tube has been used in the past due to sore throat   Elevated PSA    Hypertension    EKG PCP 02/05/11 on chart   Internal hemorrhoids    MGUS (monoclonal gammopathy of unknown significance)    Paget disease of bone    Pneumonia    Tubular adenoma of colon    Vitamin D  deficiency     Family History  Problem Relation Age of Onset   Hypertension Mother    Hypertension Father    Colon cancer Neg Hx    Esophageal cancer Neg Hx    Rectal cancer Neg Hx    Stomach cancer Neg Hx    Past Surgical History:  Procedure Laterality Date   BACK SURGERY     Lumbar decompression 1994   COLONOSCOPY  01/09/2017   Colonic polyps status post polypectomy. Small internal hemorrhoids   KNEE ARTHROSCOPY  1987   LUMBAR LAMINECTOMY/DECOMPRESSION MICRODISCECTOMY  03/05/2011   Procedure: LUMBAR LAMINECTOMY/DECOMPRESSION MICRODISCECTOMY;  Surgeon: Reyes JAYSON Billing;  Location: [facility];  Service: Orthopedics;  Laterality: N/A;  Re do Decompression L3-L5  (X-Ray)    Central   TOTAL HIP ARTHROPLASTY Right 12/16/2023   Procedure: ARTHROPLASTY, HIP, TOTAL, ANTERIOR APPROACH;  Surgeon: Fidel Rogue, MD;  Location: [facility];  Service: Orthopedics;  Laterality: Right;   Social History[1] Social History   Social History Narrative   Not on file     Immunization History   Administered Date(s) Administered   PFIZER(Purple Top)SARS-COV-2 Vaccination 04/17/2019, 05/07/2019, 01/19/2020, 08/07/2020   Zoster Recombinant(Shingrix) 01/28/2018     Objective: Vital Signs: BP 126/78   Pulse 70   Temp 97.7 F (36.5 C)   Resp 14   Ht 5' 8 (1.727 m)   Wt 174 lb (78.9 kg)   BMI 26.46 kg/m    Physical Exam Vitals and nursing note reviewed.  Constitutional:      Appearance: He is well-developed.  HENT:     Head: Normocephalic and atraumatic.  Eyes:     Conjunctiva/sclera: Conjunctivae normal.     Pupils: Pupils are equal, round, and reactive to light.  Cardiovascular:     Rate and Rhythm: Normal rate and regular rhythm.     Heart sounds: Normal heart sounds.  Pulmonary:     Effort: Pulmonary effort is normal.     Breath sounds: Normal breath sounds.  Abdominal:     General: Bowel sounds are normal.     Palpations: Abdomen is soft.  Musculoskeletal:     Cervical back: Normal range of motion and neck supple.  Skin:    General: Skin is warm and dry.     Capillary Refill: Capillary refill takes less than 2 seconds.  Neurological:     Mental Status: He is alert and oriented to person, place, and time.  Psychiatric:        Behavior: Behavior normal.      Musculoskeletal Exam: Cervical spine was in good range of motion.  He had some limitation with range of motion of the lumbar spine with forward flexion.  There was no SI joint tenderness.  Shoulder joints, elbow joints, wrist joints, MCPs, PIPs and DIPs were in good range of motion with no synovitis.  Bilateral PIP and DIP thickening was noted.  He had limited range of motion of bilateral hip joints.  Right hip joint was replaced.  Knee joints were in good range of motion without any warmth swelling or effusion.  There was no tenderness over ankles or MTPs.   CDAI Exam: CDAI Score: -- Patient Global: --; Provider Global: -- Swollen: --; Tender: -- Joint Exam 04/21/2024   No joint exam has been  documented for this visit   There is currently no information documented on the homunculus. Go to the Rheumatology activity and complete the homunculus joint exam.  Investigation: No additional findings.  Imaging: No results found.  Recent Labs: Lab Results  Component Value Date   WBC 7.2 12/17/2023   HGB 10.0 (L) 12/17/2023   PLT 187 12/17/2023   NA 135 12/17/2023   K 4.1 12/17/2023   CL 103 12/17/2023   CO2 23 12/17/2023   GLUCOSE 142 (H) 12/17/2023   BUN 21 12/17/2023   CREATININE 1.21 12/17/2023   BILITOT 1.6 (H) 12/09/2023   ALKPHOS 57 12/09/2023   AST 22 12/09/2023   ALT 15 12/09/2023   PROT 6.6 12/09/2023   ALBUMIN  4.4  12/09/2023   CALCIUM 8.0 (L) 12/17/2023   GFRAA >60 12/06/2019    November 25, 2017 MRI of lumbar spine showed interval increased expansion of L1 vertebral body and posterior process with heterogeneous marrow edema and sclerosis with appearance most compatible with Paget's disease. Mild to moderate spinal canal stenosis spanning L1 body level showed progression. Postsurgical changes were noted at L3-4 and L4-5 with mild central stenosis and severe bilateral subarticular zone narrowing. Minimal anterolisthesis of L4 on L5. Results were read by Dr. Dorothyann Burger  December 09, 2023 alkaline phosphatase 57 Speciality Comments: IV Reclast  March 2020  Procedures:  No procedures performed Allergies: Patient has no known allergies.   Assessment / Plan:     Visit Diagnoses: Paget's disease of bone - total body bone scan confirmed the diagnosis of Paget's diease localized to L1 on 04/19/18. Patient had Reclast  infusion in March 2020.  He had no recurrence of symptoms since then.  He had no tenderness on the palpation of the lumbar spine.  I will check CMP today.  Will contact him with the results.  Alkaline phosphatase has been normal.  December 09, 2023 alkaline phosphatase was 57.  Vitamin D  deficiency-vitamin D  was normal back in August  2024.  Primary osteoarthritis of both hands-he has bilateral PIP and DIP thickening.  No synovitis was noted.  He notices some stiffness in his hands.  Joint protection was discussed.  A handout on hand exercises was given.  Status post total hip replacement, right - December 16, 2023 by Dr. Fidel.  Patient had limited range of motion of bilateral hip joints without discomfort.  Patient reports good recovery.  He has been doing some stretching exercises.  Primary osteoarthritis of right knee-he reports some discomfort in his right knee joint since he had right total hip replacement.  No warmth swelling or effusion was noted.  Lower extremity muscle strength exercises were discussed.  Abnormal SPEP - MGUS, followed by hematology.  Essential hypertension-blood pressure was normal at 126/78.  Orders: No orders of the defined types were placed in this encounter.  No orders of the defined types were placed in this encounter.    Follow-Up Instructions: Return for Paget's disease.   Maya Nash, MD  Note - This record has been created using Animal nutritionist.  Chart creation errors have been sought, but may not always  have been located. Such creation errors do not reflect on  the standard of medical care.     [1]  Social History Tobacco Use   Smoking status: Never    Passive exposure: Current (very minimal)   Smokeless tobacco: Never  Vaping Use   Vaping status: Never Used  Substance Use Topics   Alcohol  use: Yes    Alcohol /week: 4.0 standard drinks of alcohol     Types: 4 Cans of beer per week    Comment: occ   Drug use: No   "

## 2024-04-21 ENCOUNTER — Ambulatory Visit: Attending: Rheumatology | Admitting: Rheumatology

## 2024-04-21 ENCOUNTER — Encounter: Payer: Self-pay | Admitting: Rheumatology

## 2024-04-21 VITALS — BP 126/78 | HR 70 | Temp 97.7°F | Resp 14 | Ht 68.0 in | Wt 174.0 lb

## 2024-04-21 DIAGNOSIS — I10 Essential (primary) hypertension: Secondary | ICD-10-CM | POA: Diagnosis not present

## 2024-04-21 DIAGNOSIS — Z96641 Presence of right artificial hip joint: Secondary | ICD-10-CM | POA: Diagnosis not present

## 2024-04-21 DIAGNOSIS — M889 Osteitis deformans of unspecified bone: Secondary | ICD-10-CM | POA: Diagnosis not present

## 2024-04-21 DIAGNOSIS — M19042 Primary osteoarthritis, left hand: Secondary | ICD-10-CM

## 2024-04-21 DIAGNOSIS — M47816 Spondylosis without myelopathy or radiculopathy, lumbar region: Secondary | ICD-10-CM

## 2024-04-21 DIAGNOSIS — M19041 Primary osteoarthritis, right hand: Secondary | ICD-10-CM

## 2024-04-21 DIAGNOSIS — R778 Other specified abnormalities of plasma proteins: Secondary | ICD-10-CM | POA: Diagnosis not present

## 2024-04-21 DIAGNOSIS — E559 Vitamin D deficiency, unspecified: Secondary | ICD-10-CM

## 2024-04-21 DIAGNOSIS — M1711 Unilateral primary osteoarthritis, right knee: Secondary | ICD-10-CM

## 2024-04-21 NOTE — Patient Instructions (Signed)

## 2024-04-22 ENCOUNTER — Ambulatory Visit: Payer: Self-pay | Admitting: Rheumatology

## 2024-04-22 LAB — COMPREHENSIVE METABOLIC PANEL WITH GFR
AG Ratio: 1.9 (calc) (ref 1.0–2.5)
ALT: 10 U/L (ref 9–46)
AST: 13 U/L (ref 10–35)
Albumin: 4.4 g/dL (ref 3.6–5.1)
Alkaline phosphatase (APISO): 75 U/L (ref 35–144)
BUN: 16 mg/dL (ref 7–25)
CO2: 28 mmol/L (ref 20–32)
Calcium: 9.4 mg/dL (ref 8.6–10.3)
Chloride: 104 mmol/L (ref 98–110)
Creat: 1.01 mg/dL (ref 0.70–1.28)
Globulin: 2.3 g/dL (ref 1.9–3.7)
Glucose, Bld: 160 mg/dL — ABNORMAL HIGH (ref 65–99)
Potassium: 4.1 mmol/L (ref 3.5–5.3)
Sodium: 142 mmol/L (ref 135–146)
Total Bilirubin: 0.7 mg/dL (ref 0.2–1.2)
Total Protein: 6.7 g/dL (ref 6.1–8.1)
eGFR: 78 mL/min/1.73m2

## 2024-04-22 NOTE — Progress Notes (Signed)
 CMP shows elevated glucose.  Please forward results to his PCP.

## 2025-04-20 ENCOUNTER — Ambulatory Visit: Admitting: Rheumatology
